# Patient Record
Sex: Female | Born: 1945 | Race: White | Hispanic: No | Marital: Married
Health system: Midwestern US, Community
[De-identification: ages and names within clinical notes are randomized; demographics above are authoritative.]

## PROBLEM LIST (undated history)

## (undated) DIAGNOSIS — I10 Essential (primary) hypertension: Secondary | ICD-10-CM

## (undated) DIAGNOSIS — R0609 Other forms of dyspnea: Secondary | ICD-10-CM

---

## 2016-08-30 ENCOUNTER — Ambulatory Visit
Admit: 2016-08-30 | Discharge: 2016-08-30 | Payer: MEDICARE | Attending: Interventional Cardiology | Primary: Interventional Cardiology

## 2016-08-30 DIAGNOSIS — I10 Essential (primary) hypertension: Secondary | ICD-10-CM

## 2016-08-30 NOTE — Progress Notes (Signed)
Perminder Lynnell DikeS Grewal, MD [facility]       Mitzi HansenArvind K Jeryn Cerney, MD [facility]     Doris CheadleBangalore S Sridhara, MD [facility]      Norberta KeensSunandan Wyline MoodA Pandya, MD [facility]     Hilary HertzShital R Parikh, MD [facility]       Santiago Bumpersajiv Singh, MD [facility]       [place] Office: [phone]       [phone]  ______________________________________________________________________    Impressions/Visit Diagnoses:    Diagnoses and all orders for this visit:    1. Essential hypertension  -     AMB POC EKG ROUTINE W/ 12 LEADS, INTER & REP  -     CBC WITH AUTOMATED DIFF; Future  -     LIPID PANEL; Future  -     METABOLIC PANEL, COMPREHENSIVE; Future  -     TSH, 3RD GENERATION; Future  -     HEMOGLOBIN A1C W/O EAG; Future    2. Mixed dyslipidemia  -     LIPID PANEL; Future  -     METABOLIC PANEL, COMPREHENSIVE; Future    3. Obesity (BMI 30.0-34.9)  -     METABOLIC PANEL, COMPREHENSIVE; Future  -     TSH, 3RD GENERATION; Future    4. Acquired hypothyroidism  -     LIPID PANEL; Future  -     TSH, 3RD GENERATION; Future    5. Controlled type 2 diabetes mellitus without complication, without long-term current use of insulin (HCC)  -     HEMOGLOBIN A1C W/O EAG; Future    6. Exertional dyspnea  -     2D ECHO COMPLETE ADULT (TTE) W OR WO CONTR; Future  -     NUCLEAR STRESS TEST; Future    7. Heart murmur  -     2D ECHO COMPLETE ADULT (TTE) W OR WO CONTR; Future  -     NUCLEAR STRESS TEST; Future             Recommendations/Plan:  Patient blood pressure is fairly well-controlled on the current medical regimen.  She has borderline diabetes.  I plan to repeat her labs, there are some mild EKG changes since her last EKG.  I will schedule her for lab work, and echocardiogram, nuclear stress test and carotid ultrasound especially in view of her recent symptoms of dizzines          Follow-up Disposition: Not on File  ______________________________________________________________________    HPI    Patient seen in our office after her last office visit in July 2016.  She  last saw Dr. Allen NorrisMentakis.  He was following with Dr. Kai Levinshandra Menon, who has now retired.  Patient states that she has a sensation of occasional squeezing sensation in the left arm, not related to any exertion, lasts less than a minute, this is followed by a sensation of gas in the stomach.  These episodes are infrequent  Patient states that she can walk about a mile before she gets short of breath.  She mild shortness of breath climbing more than one flight of stairs.  She denies any history of palpitations.  She had symptoms of vertigo about 2 weeks ago which have not resolved.  She has no previous history of any myocardia infarction, exertional angina, congestive heart failure, orthopnea or PND    Patient's previous surgeries include cholecystectomy, cataract surgery pilonidal cyst, arthroscopic meniscus surgery for the left knee  She has a history of hypertension, hypercholesterolemia, patient  has no history of any stroke or seizures.    Patient is allergic to the contrast agents with which she develops a rash    Patient quit smoking 30 years ago  Patient drinks about 3 glasses of wine a week    Patient mother died age of 44 from complication of Alzheimer's and possibly a heart condition .Her father died age of 32            Past Medical History:   Diagnosis Date   ??? Hyperlipidemia    ??? Hypertension    ??? Thyroid disease        Past Surgical History:   Procedure Laterality Date   ??? HX CATARACT REMOVAL Bilateral 2013   ??? HX CHOLECYSTECTOMY  2008         Current Outpatient Prescriptions:   ???  levothyroxine (SYNTHROID) 75 mcg tablet, Take 75 mcg by mouth daily., Disp: , Rfl:   ???  losartan-hydroCHLOROthiazide (HYZAAR) 100-25 mg per tablet, Take 1 Tab by mouth daily., Disp: , Rfl:   ???  rosuvastatin (CRESTOR) 10 mg tablet, Take 10 mg by mouth daily., Disp: , Rfl:   ???  TRAVATAN Z 0.004 % ophthalmic solution, , Disp: , Rfl:     Allergies   Allergen Reactions   ??? Contrast Agent [Iodine] Hives       Family History    Problem Relation Age of Onset   ??? Alzheimer Mother    ??? Heart Disease Father        Social History     Social History   ??? Marital status: MARRIED     Spouse name: N/A   ??? Number of children: N/A   ??? Years of education: N/A     Occupational History   ??? Not on file.     Social History Main Topics   ??? Smoking status: Former Smoker     Packs/day: 2.00     Years: 20.00     Quit date: 08/31/1986   ??? Smokeless tobacco: Never Used   ??? Alcohol use 1.8 oz/week     3 Glasses of wine per week   ??? Drug use: No   ??? Sexual activity: Not on file     Other Topics Concern   ??? Not on file     Social History Narrative   ??? No narrative on file         Review of systems  Review of Systems   Constitutional: Negative for weight loss.   HENT:        Popping sensation in the ears intermittently   Respiratory: Negative for cough and wheezing.    Cardiovascular: Positive for chest pain. Negative for palpitations, orthopnea, leg swelling and PND.   Musculoskeletal: Negative for myalgias.   Skin: Negative for rash.   Neurological: Positive for dizziness ( Fleeting).        Vertigo about 2 weeks ago, she went to an urgent care center where she was treated with meclizine with resolution of her symptoms in about a week.         Physical Exam:      Visit Vitals   ??? BP 122/68 (BP 1 Location: Right arm, BP Patient Position: Sitting)   ??? Pulse 66   ??? Resp 18   ??? Ht 5\' 5"  (1.651 m)   ??? Wt 200 lb (90.7 kg)   ??? BMI 33.28 kg/m2       Physical Exam:      Physical  Exam   Constitutional: She is oriented to person, place, and time. She appears well-developed and well-nourished. No distress.   HENT:   Head: Normocephalic.   Mouth/Throat: Oropharynx is clear and moist.   Eyes: No scleral icterus.   Neck: Neck supple. No JVD present. Carotid bruit is not present. No thyromegaly present.   Cardiovascular: Normal rate and regular rhythm.  Exam reveals no gallop and no friction rub.    Murmur ( gr1/6 murmur) heard.   Pulmonary/Chest: No respiratory distress. She has no wheezes. She has no rales.   Abdominal: She exhibits no distension and no mass. There is no tenderness. There is no rebound and no guarding.   Moderate obesity   Musculoskeletal: She exhibits no edema.   Neurological: She is alert and oriented to person, place, and time.   Skin: Skin is warm and dry. No rash noted. No erythema. No pallor.   Psychiatric: She has a normal mood and affect. Her behavior is normal. Judgment and thought content normal.   Vitals reviewed.    Tests - Procedures Reviewed      ECG   Results for orders placed or performed in visit on 08/30/16   AMB POC EKG ROUTINE W/ 12 LEADS, INTER & REP    Impression    Sinus  Rhythm   Low voltage in precordial leads.    -  Nonspecific T-abnormality.     ABNORMAL         Targeted ultrasound on 04/28/2014 was normal  Stress test:         Labs - reviewed     Lab data from 12/20/2014 reveals normal electrolytes BUN is 20 creatinine 0.67  Liver functions are normal  Total cholesterol 150 LDL 76  Hemoglobin 12.8 hematocrit 38.3 white count 5.1 platelet count of 210,000  Hemoglobin A1c of 6.1    Care Plan Discussion   [x] Patient   [] Family   [] Other Physician :    Counseling Performed   []  Smoking cessation 3-10 minutes []  Smoking cessation >10 minutes     []  Dietary Counseling  []  Exercise    []  Importance of Weight Loss and Risks of Obesity   []  Anticoagulation Counseling  []  Fall Precautions   []  Symptoms that would require immediate follow up    []  Medication compliance/usage/side effect     []   >50% of visit spent in counseling and coordination of care  Total time spent approximately  minutes    []   Additional time spent approximately 30 minutes    Records Requested / Reviewed   []  Labs/Test results requested from other physicians  []  Hospital records requested from   []  Hospital records / Records from other physicians reviewed - summary as above. Copies in chart.

## 2016-09-06 NOTE — Telephone Encounter (Signed)
Requested Prescriptions     Pending Prescriptions Disp Refills   ??? levothyroxine (SYNTHROID) 75 mcg tablet 90 Tab 1     Sig: Take 1 Tab by mouth daily.   ??? losartan-hydroCHLOROthiazide (HYZAAR) 100-25 mg per tablet 90 Tab 1     Sig: Take 1 Tab by mouth daily.   ??? rosuvastatin (CRESTOR) 10 mg tablet 90 Tab 1     Sig: Take 1 Tab by mouth daily.

## 2016-09-07 MED ORDER — LEVOTHYROXINE 75 MCG TAB
75 mcg | ORAL_TABLET | Freq: Every day | ORAL | 1 refills | Status: DC
Start: 2016-09-07 — End: 2017-01-15

## 2016-09-07 MED ORDER — LOSARTAN-HYDROCHLOROTHIAZIDE 100 MG-25 MG TAB
100-25 mg | ORAL_TABLET | Freq: Every day | ORAL | 1 refills | Status: DC
Start: 2016-09-07 — End: 2017-01-15

## 2016-09-07 MED ORDER — ROSUVASTATIN 10 MG TAB
10 mg | ORAL_TABLET | Freq: Every day | ORAL | 1 refills | Status: DC
Start: 2016-09-07 — End: 2017-01-17

## 2016-10-01 ENCOUNTER — Encounter

## 2016-10-04 ENCOUNTER — Ambulatory Visit
Admit: 2016-10-04 | Discharge: 2016-10-04 | Payer: MEDICARE | Attending: Interventional Cardiology | Primary: Interventional Cardiology

## 2016-10-04 DIAGNOSIS — I10 Essential (primary) hypertension: Secondary | ICD-10-CM

## 2016-10-04 NOTE — Progress Notes (Signed)
Perminder Lynnell Dike, MD [facility]       [name], MD [facility]     [name], MD [facility]      [name] [name], MD [facility]     Hilary Hertz, MD [facility]       [name], MD [facility] Office: [phone]       [facility] [phone]  ______________________________________________________________________    Impressions/Visit Diagnoses:    Diagnoses and all orders for this visit:    1. Benign essential HTN  -     CT CORONARY ART W STRUC MORPH FUNC; Future    2. Mixed dyslipidemia  -     CT CORONARY ART W STRUC MORPH FUNC; Future    3. Abnormal stress ECG with treadmill  -     CT CORONARY ART W STRUC MORPH FUNC; Future    4. Exertional dyspnea  -     CT CORONARY ART W STRUC MORPH FUNC; Future             Recommendations/Plan:  Patient blood pressure is at goal.  Her stress test was markedly positive by EKG criteria although nuclear images did not reveal any ischemia or infarction.  She also developed shortness of breath during the stress.  Her echocardiogram reveals normal LV function and no significant valvular heart disease.  Her carotid ultrasound only revealed minor plaque disease with no obstructive lesions.  Her hemoglobin A1c and thyroid functions are normal  I have advised her to increase her Crestor to 20 mg a day  Although the nuclear images do not reveal any ischemia or infarction, her stress test was markedly positive with symptoms of shortness of breath.  I cannot entirely exclude underlying coronary disease.  I have advised her to undergo CT coronary angiogram to exclude any significant coronary disease.  If does not reveal any obstructive lesion she will remain on aggressive risk factor modification.  I have also advised her to watch her diet, exercise and attempt to lose weight          Follow-up Disposition:  Return in about 6 weeks (around 11/15/2016).  ______________________________________________________________________    HPI   Patient is stable from a cardiac standpoint.  Her symptoms of dizziness have now resolved.  She states she does a lot of for tight knitting, and has occasional discomfort in the pectoral area      Past Medical History:   Diagnosis Date   ??? Hyperlipidemia    ??? Hypertension    ??? Thyroid disease        Past Surgical History:   Procedure Laterality Date   ??? HX CATARACT REMOVAL Bilateral 2013   ??? HX CHOLECYSTECTOMY  2008         Current Outpatient Prescriptions:   ???  levothyroxine (SYNTHROID) 75 mcg tablet, Take 1 Tab by mouth daily., Disp: 90 Tab, Rfl: 1  ???  losartan-hydroCHLOROthiazide (HYZAAR) 100-25 mg per tablet, Take 1 Tab by mouth daily., Disp: 90 Tab, Rfl: 1  ???  rosuvastatin (CRESTOR) 10 mg tablet, Take 1 Tab by mouth daily., Disp: 90 Tab, Rfl: 1  ???  TRAVATAN Z 0.004 % ophthalmic solution, , Disp: , Rfl:     Allergies   Allergen Reactions   ??? Contrast Agent [Iodine] Hives       Family History   Problem Relation Age of Onset   ??? Alzheimer Mother    ??? Heart Disease Father  Social History     Social History   ??? Marital status: MARRIED     Spouse name: N/A   ??? Number of children: N/A   ??? Years of education: N/A     Occupational History   ??? Not on file.     Social History Main Topics   ??? Smoking status: Former Smoker     Packs/day: 2.00     Years: 20.00     Quit date: 08/31/1986   ??? Smokeless tobacco: Never Used   ??? Alcohol use 1.8 oz/week     3 Glasses of wine per week   ??? Drug use: No   ??? Sexual activity: Not on file     Other Topics Concern   ??? Not on file     Social History Narrative         Review of systems  Review of Systems   Constitutional: Negative for weight loss.   Respiratory: Negative for cough and wheezing.    Cardiovascular: Negative for chest pain ( when does a lot of knitting), palpitations, orthopnea, leg swelling and PND.   Musculoskeletal: Negative for myalgias.   Skin: Negative for rash.   Neurological: Negative for dizziness.   Psychiatric/Behavioral: The patient is nervous/anxious.           Physical Exam:      Visit Vitals   ??? BP 112/60 (BP 1 Location: Right arm, BP Patient Position: Sitting)   ??? Pulse 80   ??? Resp 18   ??? Ht  (1.651 m)   ??? Wt 201 lb (91.2 kg)   ??? BMI 33.45 kg/m2       Physical Exam:      Physical Exam   Constitutional: She is oriented to person, place, and time. She appears well-developed and well-nourished. No distress.   HENT:   Head: Normocephalic.   Mouth/Throat: Oropharynx is clear and moist.   Eyes: No scleral icterus.   Neck: Neck supple. No JVD present. Carotid bruit is not present. No thyromegaly present.   Cardiovascular: Normal rate, regular rhythm and normal heart sounds.  Exam reveals no gallop and no friction rub.    No murmur heard.  Pulmonary/Chest: No respiratory distress. She has no wheezes. She has no rales.   Abdominal: She exhibits no distension and no mass. There is no tenderness. There is no rebound and no guarding.   Moderate obesity   Musculoskeletal: She exhibits no edema.   Neurological: She is alert and oriented to person, place, and time.   Skin: Skin is warm and dry. No rash noted. No erythema. No pallor.   Psychiatric: She has a normal mood and affect. Her behavior is normal. Judgment and thought content normal.   Vitals reviewed.    Tests - Procedures Reviewed          Echo : 09/27/2016:          Stress test: 09/27/2016:    Carotid ultrasound:      Labs - reviewed         Care Plan Discussion   Patient   Family   Other Physician :    Counseling Performed    Smoking cessation 3-10 minutes  Smoking cessation >10 minutes      Dietary Counseling   Exercise     Importance of Weight Loss and Risks of Obesity    Anticoagulation Counseling   Fall Precautions    Symptoms that would require immediate follow up      Medication compliance/usage/side effect       >50% of visit spent in counseling and coordination of care  Total time spent approximately  minutes      Additional time spent approximately __ minutes     Records Requested / Reviewed    Labs/Test results requested from other physicians   Hospital records requested from    Hospital records / Records from other physicians reviewed - summary as above. Copies in chart.

## 2016-10-11 MED ORDER — PREDNISONE 10 MG TAB
10 mg | ORAL_TABLET | Freq: Three times a day (TID) | ORAL | 0 refills | Status: AC
Start: 2016-10-11 — End: 2016-10-14

## 2016-10-31 ENCOUNTER — Encounter

## 2016-11-15 ENCOUNTER — Ambulatory Visit
Admit: 2016-11-15 | Discharge: 2016-11-15 | Payer: MEDICARE | Attending: Interventional Cardiology | Primary: Interventional Cardiology

## 2016-11-15 DIAGNOSIS — I10 Essential (primary) hypertension: Secondary | ICD-10-CM

## 2016-11-15 NOTE — Progress Notes (Addendum)
Amanda Lynnell Dike, MD [name]       Amanda Hansen, MD [facility]     [name], MD [facility]      [name] [name], MD [facility]     Hilary Hertz, MD [facility]       [name], MD [facility] Office: [phone]       [facility] [phone]  ______________________________________________________________________    Impressions/Visit Diagnoses:    Diagnoses and all orders for this visit:    1. Benign essential HTN    2. Mixed dyslipidemia             Recommendations/Plan:  I had a long discussion with the patient regarding the results of her CT coronary angiogram.  Her coronary calcium score 0.  She only has about 30% stenosis of the left circumflex coronary artery.. However the mention of dilatation of the right ventricle.  I will discuss this with the radiologist.  Her blood pressure is well controlled.  Patient is unable to tolerate a higher dose of rosuvastatin.  I plan to see the patient in the office in about 4 months time.      CT RV findings discussed with Dr Eduard Clos,( radiologist),he feels the RV size and volumes are normal for her weight.  No further work up for RV (message left for patient as she requested)          Follow-up Disposition:  Return in about 4 months (around 03/17/2017).  ______________________________________________________________________    HPI  Patient is doing well since her last office visit.  She has lost 7 pounds of weight.  Patient exercises on the bicycle and the treadmill at home.  She exercises for about half an hour.  She is at a speed of 2.5 mph with no incline.      Past Medical History:   Diagnosis Date   ??? Hyperlipidemia    ??? Hypertension    ??? Thyroid disease        Past Surgical History:   Procedure Laterality Date   ??? HX CATARACT REMOVAL Bilateral 2013   ??? HX CHOLECYSTECTOMY  2008         Current Outpatient Prescriptions:   ???  aspirin delayed-release 81 mg tablet, Take  by mouth daily., Disp: , Rfl:    ???  levothyroxine (SYNTHROID) 75 mcg tablet, Take 1 Tab by mouth daily., Disp: 90 Tab, Rfl: 1  ???  losartan-hydroCHLOROthiazide (HYZAAR) 100-25 mg per tablet, Take 1 Tab by mouth daily., Disp: 90 Tab, Rfl: 1  ???  rosuvastatin (CRESTOR) 10 mg tablet, Take 1 Tab by mouth daily., Disp: 90 Tab, Rfl: 1  ???  TRAVATAN Z 0.004 % ophthalmic solution, , Disp: , Rfl:     Allergies   Allergen Reactions   ??? Contrast Agent [Iodine] Hives       Family History   Problem Relation Age of Onset   ??? Alzheimer Mother    ??? Heart Disease Father        Social History     Social History   ??? Marital status: MARRIED     Spouse name: N/A   ??? Number of children: N/A   ??? Years of education: N/A     Occupational History   ??? Not on file.     Social History Main Topics   ??? Smoking status: Former Smoker     Packs/day: 2.00     Years: 20.00  Quit date: 08/31/1986   ??? Smokeless tobacco: Never Used   ??? Alcohol use 1.8 oz/week     3 Glasses of wine per week   ??? Drug use: No   ??? Sexual activity: Not on file     Other Topics Concern   ??? Not on file     Social History Narrative         Review of systems  Review of Systems   Constitutional: Negative for weight loss.   Eyes:        History of glaucoma   Respiratory: Positive for cough. Negative for wheezing.    Cardiovascular: Negative for chest pain, palpitations, orthopnea, leg swelling and PND.   Musculoskeletal: Positive for joint pain ( right hip and left knee discomfort). Negative for myalgias.   Skin: Negative for rash.   Neurological: Negative for dizziness.         Physical Exam:      Visit Vitals   ??? BP 104/62 (BP 1 Location: Right arm, BP Patient Position: Sitting)   ??? Resp 16   ??? Ht 5\' 5"  (1.651 m)   ??? Wt 194 lb (88 kg)   ??? BMI 32.28 kg/m2       Physical Exam:      Physical Exam   Constitutional: She is oriented to person, place, and time. She appears well-developed and well-nourished. No distress.   HENT:   Head: Normocephalic.   Mouth/Throat: Oropharynx is clear and moist.    Eyes: No scleral icterus.   Neck: Neck supple. No JVD present. Carotid bruit is not present. No thyromegaly present.   Cardiovascular: Normal rate, regular rhythm and normal heart sounds.  Exam reveals no gallop and no friction rub.    No murmur heard.  Pulmonary/Chest: No respiratory distress. She has no wheezes. She has no rales.   Abdominal: She exhibits no distension and no mass. There is no tenderness. There is no rebound and no guarding.   Mild obesity   Musculoskeletal: She exhibits no edema.   Neurological: She is alert and oriented to person, place, and time.   Skin: Skin is warm and dry. No rash noted. No erythema. No pallor.   Psychiatric: She has a normal mood and affect. Her behavior is normal. Judgment and thought content normal.   Vitals reviewed.    Tests - Procedures Reviewed      CT coronary angiogram:      Labs - reviewed         Care Plan Discussion   [x] Patient   [] Family   [] Other Physician :    Counseling Performed   []  Smoking cessation 3-10 minutes []  Smoking cessation >10 minutes     []  Dietary Counseling  [x]  Exercise    [x]  Importance of Weight Loss and Risks of Obesity   []  Anticoagulation Counseling  []  Fall Precautions   []  Symptoms that would require immediate follow up    []  Medication compliance/usage/side effect     []   >50% of visit spent in counseling and coordination of care  Total time spent approximately  minutes    []   Additional time spent approximately __ minutes    Records Requested / Reviewed   []  Labs/Test results requested from other physicians  []  Hospital records requested from   []  Hospital records / Records from other physicians reviewed - summary as above. Copies in chart.

## 2017-01-16 MED ORDER — LOSARTAN-HYDROCHLOROTHIAZIDE 100 MG-25 MG TAB
100-25 mg | ORAL_TABLET | ORAL | 1 refills | Status: DC
Start: 2017-01-16 — End: 2017-04-22

## 2017-01-16 MED ORDER — LEVOTHYROXINE 75 MCG TAB
75 mcg | ORAL_TABLET | ORAL | 1 refills | Status: DC
Start: 2017-01-16 — End: 2017-04-22

## 2017-01-17 MED ORDER — ROSUVASTATIN 10 MG TAB
10 mg | ORAL_TABLET | Freq: Every day | ORAL | 1 refills | Status: DC
Start: 2017-01-17 — End: 2017-04-22

## 2017-01-17 NOTE — Telephone Encounter (Signed)
Requested Prescriptions     Pending Prescriptions Disp Refills   ??? rosuvastatin (CRESTOR) 10 mg tablet 90 Tab 1     Sig: Take 1 Tab by mouth daily.

## 2017-03-28 ENCOUNTER — Encounter: Attending: Interventional Cardiology | Primary: Interventional Cardiology

## 2017-04-04 ENCOUNTER — Encounter: Attending: Interventional Cardiology | Primary: Interventional Cardiology

## 2017-04-15 ENCOUNTER — Encounter: Payer: MEDICARE | Attending: Interventional Cardiology | Primary: Interventional Cardiology

## 2017-04-18 ENCOUNTER — Encounter

## 2017-04-20 LAB — LIPID PANEL
Cholesterol, total: 145 mg/dL (ref 100–199)
HDL Cholesterol: 52 mg/dL (ref >39)
LDL, calculated: 85 mg/dL (ref 0–99)
Triglyceride: 40 mg/dL (ref 0–149)
VLDL, calculated: 8 mg/dL (ref 5–40)

## 2017-04-20 LAB — METABOLIC PANEL, COMPREHENSIVE
A-G Ratio: 1.9 (ref 1.2–2.2)
ALT (SGPT): 16 IU/L (ref 0–32)
AST (SGOT): 21 IU/L (ref 0–40)
Albumin: 4.4 g/dL (ref 3.5–4.8)
Alk. phosphatase: 52 IU/L (ref 39–117)
BUN/Creatinine ratio: 29 — ABNORMAL HIGH (ref 12–28)
BUN: 23 mg/dL (ref 8–27)
Bilirubin, total: 0.3 mg/dL (ref 0.0–1.2)
CO2: 26 mmol/L (ref 20–29)
Calcium: 9.7 mg/dL (ref 8.7–10.3)
Chloride: 100 mmol/L (ref 96–106)
Creatinine: 0.79 mg/dL (ref 0.57–1.00)
GFR est AA: 87 mL/min/{1.73_m2} (ref >59)
GFR est non-AA: 76 mL/min/{1.73_m2} (ref >59)
GLOBULIN, TOTAL: 2.3 g/dL (ref 1.5–4.5)
Glucose: 101 mg/dL — ABNORMAL HIGH (ref 65–99)
Potassium: 3.8 mmol/L (ref 3.5–5.2)
Protein, total: 6.7 g/dL (ref 6.0–8.5)
Sodium: 142 mmol/L (ref 134–144)

## 2017-04-20 LAB — CBC WITH AUTOMATED DIFF
ABS. BASOPHILS: 0 10*3/uL (ref 0.0–0.2)
ABS. EOSINOPHILS: 0.1 10*3/uL (ref 0.0–0.4)
ABS. IMM. GRANS.: 0 10*3/uL (ref 0.0–0.1)
ABS. MONOCYTES: 0.3 10*3/uL (ref 0.1–0.9)
ABS. NEUTROPHILS: 2.8 10*3/uL (ref 1.4–7.0)
Abs Lymphocytes: 1.5 10*3/uL (ref 0.7–3.1)
BASOPHILS: 0 %
EOSINOPHILS: 2 %
HCT: 34.6 % (ref 34.0–46.6)
HGB: 12 g/dL (ref 11.1–15.9)
IMMATURE GRANULOCYTES: 0 %
Lymphocytes: 33 %
MCH: 31.4 pg (ref 26.6–33.0)
MCHC: 34.7 g/dL (ref 31.5–35.7)
MCV: 91 fL (ref 79–97)
MONOCYTES: 5 %
NEUTROPHILS: 60 %
PLATELET: 182 10*3/uL (ref 150–379)
RBC: 3.82 x10E6/uL (ref 3.77–5.28)
RDW: 13.2 % (ref 12.3–15.4)
WBC: 4.7 10*3/uL (ref 3.4–10.8)

## 2017-04-20 LAB — TSH 3RD GENERATION: TSH: 1.66 u[IU]/mL (ref 0.450–4.500)

## 2017-04-22 ENCOUNTER — Ambulatory Visit
Admit: 2017-04-22 | Discharge: 2017-04-22 | Payer: MEDICARE | Attending: Interventional Cardiology | Primary: Interventional Cardiology

## 2017-04-22 DIAGNOSIS — I10 Essential (primary) hypertension: Secondary | ICD-10-CM

## 2017-04-22 NOTE — Progress Notes (Signed)
Perminder Christen Butter, MD [facility]       Arlana Hove, MD [place], MD [facility]      Voncille Lo Carolynne Edouard, MD [place], MD [place], MD [facility] Office: [phone]       [facility] [phone]  ______________________________________________________________________    Impressions/Visit Diagnoses:    Diagnoses and all orders for this visit:    1. Essential hypertension  -     AMB POC EKG ROUTINE W/ 12 LEADS, INTER & REP    2. Benign essential HTN    3. Mixed dyslipidemia    4. Coronary artery disease involving native coronary artery of native heart without angina pectoris             Recommendations/Plan:  1. Patient is stable from a cardiac standpoint.  Her blood pressure is at goal.  She remains completely asymptomatic.  Her LDL is not quite at goal.  I have advised her to increase the dose of Rosuvastatin to 20 mg a day.  Patient states that she cannot tolerate more than 10 mg of rosuvastatin.          Follow-up Disposition:  Return in about 4 months (around 08/20/2017).  ______________________________________________________________________    HPI  Patient was stable since the last office visit.  She has had no interval illnesses.  Since April 2001 patient has lost 25 pounds of weight.  Patient is a very active at her house including going up and down the stairs, packing with no symptoms of chest pain or shortness of breath.  She has no orthopnea or PND.  She states she walks about 4 miles in and around her house.      Past Medical History:   Diagnosis Date   ??? Hyperlipidemia    ??? Hypertension    ??? Thyroid disease        Past Surgical History:   Procedure Laterality Date   ??? HX CATARACT REMOVAL Bilateral 2013   ??? HX CHOLECYSTECTOMY  2008         Current Outpatient Medications:   ???  rosuvastatin (CRESTOR) 10 mg tablet, Take 1 Tab by mouth daily., Disp: 90 Tab, Rfl: 1  ???  levothyroxine (SYNTHROID) 75 mcg tablet, TAKE 1 TABLET EVERY DAY, Disp:  90 Tab, Rfl: 1  ???  losartan-hydroCHLOROthiazide (HYZAAR) 100-25 mg per tablet, TAKE 1 TABLET EVERY DAY, Disp: 90 Tab, Rfl: 1  ???  aspirin delayed-release 81 mg tablet, Take  by mouth daily., Disp: , Rfl:   ???  TRAVATAN Z 0.004 % ophthalmic solution, , Disp: , Rfl:     Allergies   Allergen Reactions   ??? Contrast Agent [Iodine] Hives       Family History   Problem Relation Age of Onset   ??? Alzheimer Mother    ??? Heart Disease Father        Social History     Socioeconomic History   ??? Marital status: MARRIED     Spouse name: Not on file   ??? Number of children: Not on file   ??? Years of education: Not on file   ??? Highest education level: Not on file   Social Needs   ??? Financial resource strain: Not on file   ??? Food insecurity - worry: Not on file   ??? Food insecurity - inability:  Not on file   ??? Transportation needs - medical: Not on file   ??? Transportation needs - non-medical: Not on file   Occupational History   ??? Not on file   Tobacco Use   ??? Smoking status: Former Smoker     Packs/day: 2.00     Years: 20.00     Pack years: 40.00     Last attempt to quit: 08/31/1986     Years since quitting: 30.6   ??? Smokeless tobacco: Never Used   Substance and Sexual Activity   ??? Alcohol use: Yes     Alcohol/week: 1.8 oz     Types: 3 Glasses of wine per week   ??? Drug use: No   ??? Sexual activity: Not on file   Other Topics Concern   ??? Not on file   Social History Narrative   ??? Not on file         Review of systems  Review of Systems   Constitutional: Negative for weight loss.   Respiratory: Negative for cough and wheezing.    Cardiovascular: Negative for chest pain, palpitations, orthopnea, leg swelling and PND.   Musculoskeletal: Negative for myalgias.   Skin: Negative for rash.   Neurological: Negative for dizziness.         Physical Exam:      Visit Vitals  BP 114/60 (BP 1 Location: Right arm, BP Patient Position: Sitting)   Pulse 67   Resp 16   Ht 5' 5"  (1.651 m)   Wt 176 lb (79.8 kg)   BMI 29.29 kg/m??       Physical Exam:       Physical Exam   Constitutional: She is oriented to person, place, and time. She appears well-developed and well-nourished. No distress.   HENT:   Head: Normocephalic.   Mouth/Throat: Oropharynx is clear and moist.   Eyes: No scleral icterus.   Neck: Neck supple. No JVD present. Carotid bruit is not present. No thyromegaly present.   Cardiovascular: Normal rate, regular rhythm and normal heart sounds. Exam reveals no gallop and no friction rub.   No murmur heard.  Pulmonary/Chest: No respiratory distress. She has no wheezes. She has no rales.   Abdominal: She exhibits no distension and no mass. There is no tenderness. There is no rebound and no guarding.   Musculoskeletal: She exhibits no edema.   Neurological: She is alert and oriented to person, place, and time.   Skin: Skin is warm and dry. No rash noted. No erythema. No pallor.   Psychiatric: She has a normal mood and affect. Her behavior is normal. Judgment and thought content normal.   Vitals reviewed.    Tests - Procedures Reviewed      ECG   Results for orders placed or performed in visit on 04/22/17   AMB POC EKG ROUTINE W/ 12 LEADS, INTER & REP    Impression    Sinus  Rhythm   nonspecific st t wave changes    ABNORMAL               Labs - reviewed     Results for AMEYA, VOWELL (MRN [id number]) as of 04/22/2017 14:04   Ref. Range 04/19/2017 09:07   WBC Latest Ref Range: 3.4 - 10.8 x10E3/uL 4.7   RBC Latest Ref Range: 3.77 - 5.28 x10E6/uL 3.82   HGB Latest Ref Range: 11.1 - 15.9 g/dL 12.0   HCT Latest Ref Range: 34.0 - 46.6 % 34.6   MCV  Latest Ref Range: 79 - 97 fL 91   MCH Latest Ref Range: 26.6 - 33.0 pg 31.4   MCHC Latest Ref Range: 31.5 - 35.7 g/dL 34.7   RDW Latest Ref Range: 12.3 - 15.4 % 13.2   PLATELET Latest Ref Range: 150 - 379 x10E3/uL 182   Results for CHRISHAWNA, FARINA (MRN [id number]) as of 04/22/2017 14:04   Ref. Range 04/19/2017 09:07   Sodium Latest Ref Range: 134 - 144 mmol/L 142   Potassium Latest Ref Range: 3.5 - 5.2 mmol/L 3.8    Chloride Latest Ref Range: 96 - 106 mmol/L 100   CO2 Latest Ref Range: 20 - 29 mmol/L 26   Glucose Latest Ref Range: 65 - 99 mg/dL 101 (H)   BUN Latest Ref Range: 8 - 27 mg/dL 23   Creatinine Latest Ref Range: 0.57 - 1.00 mg/dL 0.79   BUN/Creatinine ratio Latest Ref Range: 12 - 28  29 (H)   Calcium Latest Ref Range: 8.7 - 10.3 mg/dL 9.7   GFR est non-AA Latest Ref Range: >59 mL/min/1.73 76   GFR est AA Latest Ref Range: >59 mL/min/1.73 87   Bilirubin, total Latest Ref Range: 0.0 - 1.2 mg/dL 0.3   Protein, total Latest Ref Range: 6.0 - 8.5 g/dL 6.7   Albumin Latest Ref Range: 3.5 - 4.8 g/dL 4.4   A-G Ratio Latest Ref Range: 1.2 - 2.2  1.9   ALT (SGPT) Latest Ref Range: 0 - 32 IU/L 16   AST Latest Ref Range: 0 - 40 IU/L 21   Alk. phosphatase Latest Ref Range: 39 - 117 IU/L 52   Triglyceride Latest Ref Range: 0 - 149 mg/dL 40   Cholesterol, total Latest Ref Range: 100 - 199 mg/dL 145   HDL Cholesterol Latest Ref Range: >39 mg/dL 52   LDL, calculated Latest Ref Range: 0 - 99 mg/dL 85   VLDL, calculated Latest Ref Range: 5 - 40 mg/dL 8       Care Plan Discussion   [] Patient   [] Family   [] Other Physician :    Counseling Performed   []  Smoking cessation 3-10 minutes []  Smoking cessation >10 minutes     []  Dietary Counseling  []  Exercise    []  Importance of Weight Loss and Risks of Obesity   []  Anticoagulation Counseling  []  Fall Precautions   []  Symptoms that would require immediate follow up    []  Medication compliance/usage/side effect     []   >50% of visit spent in counseling and coordination of care  Total time spent approximately  minutes    []   Additional time spent approximately __ minutes    Records Requested / Reviewed   []  Labs/Test results requested from other physicians  []  Hospital records requested from   []  Hospital records / Records from other physicians reviewed - summary as above. Copies in chart.

## 2017-04-22 NOTE — Telephone Encounter (Signed)
Requested Prescriptions     Pending Prescriptions Disp Refills   ??? losartan-hydroCHLOROthiazide (HYZAAR) 100-25 mg per tablet 90 Tab 1     Sig: Take 1 Tab by mouth daily.   ??? rosuvastatin (CRESTOR) 10 mg tablet 90 Tab 1     Sig: Take 1 Tab by mouth daily.   ??? levothyroxine (SYNTHROID) 75 mcg tablet 90 Tab 1     Sig: Take 1 Tab by mouth.

## 2017-04-24 MED ORDER — LOSARTAN-HYDROCHLOROTHIAZIDE 100 MG-25 MG TAB
100-25 mg | ORAL_TABLET | Freq: Every day | ORAL | 1 refills | Status: AC
Start: 2017-04-24 — End: ?

## 2017-04-24 MED ORDER — LEVOTHYROXINE 75 MCG TAB
75 mcg | ORAL_TABLET | Freq: Every day | ORAL | 1 refills | Status: AC
Start: 2017-04-24 — End: ?

## 2017-04-24 MED ORDER — ROSUVASTATIN 10 MG TAB
10 mg | ORAL_TABLET | Freq: Every day | ORAL | 1 refills | Status: AC
Start: 2017-04-24 — End: ?

## 2022-01-05 IMAGING — DX LUMBAR SPINE COMPLETE 4 VIEWS
1 series · 5 of 5 positions shown · non-contrast
Comparison: None

________________________________________________________________________________________________ 
LUMBAR SPINE COMPLETE 4 VIEWS, 01/05/2022 [DATE]: 
CLINICAL INDICATION:  Right hip pain

[Series 1: AP · U · 0.14mm/px · 5 of 5 slices shown]
[im 1/5]
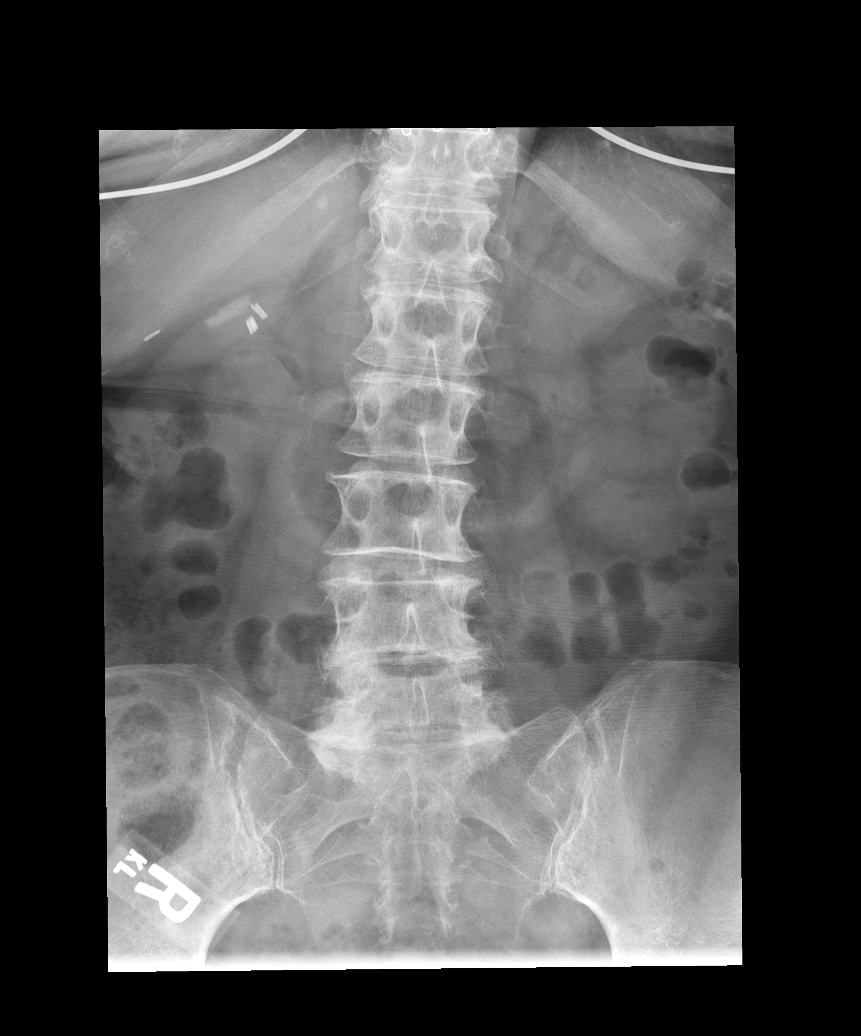
[im 2/5]
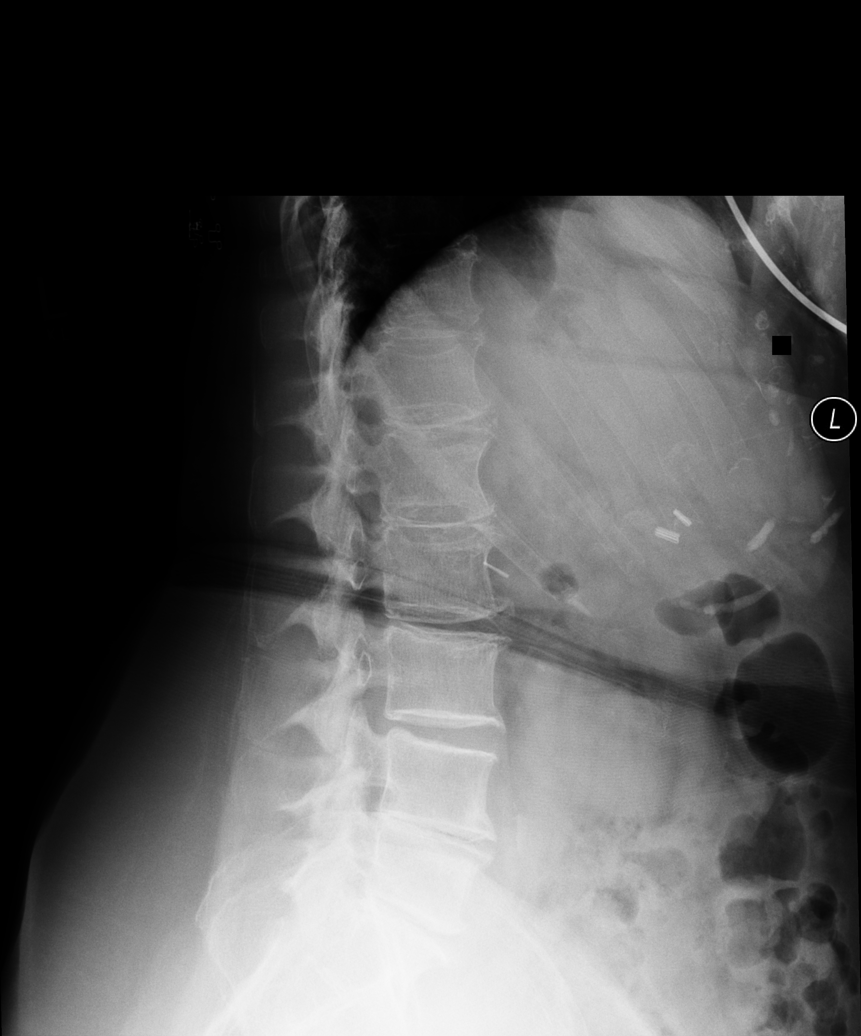
[im 3/5]
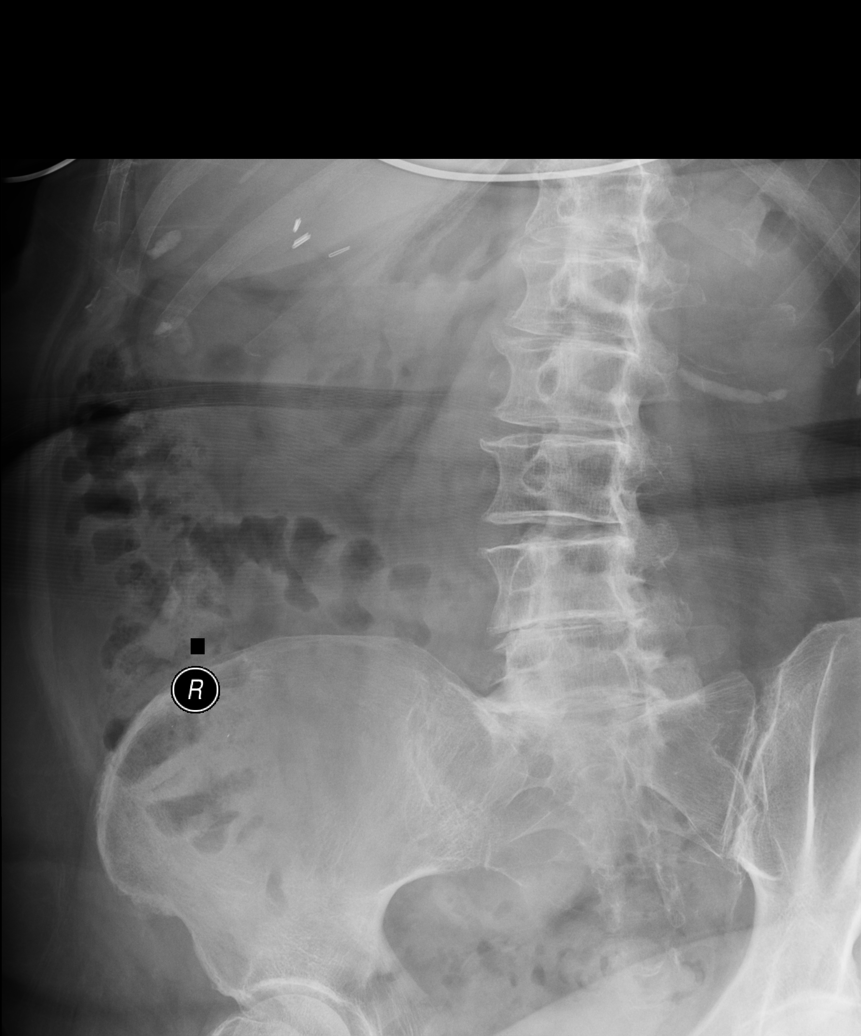
[im 4/5]
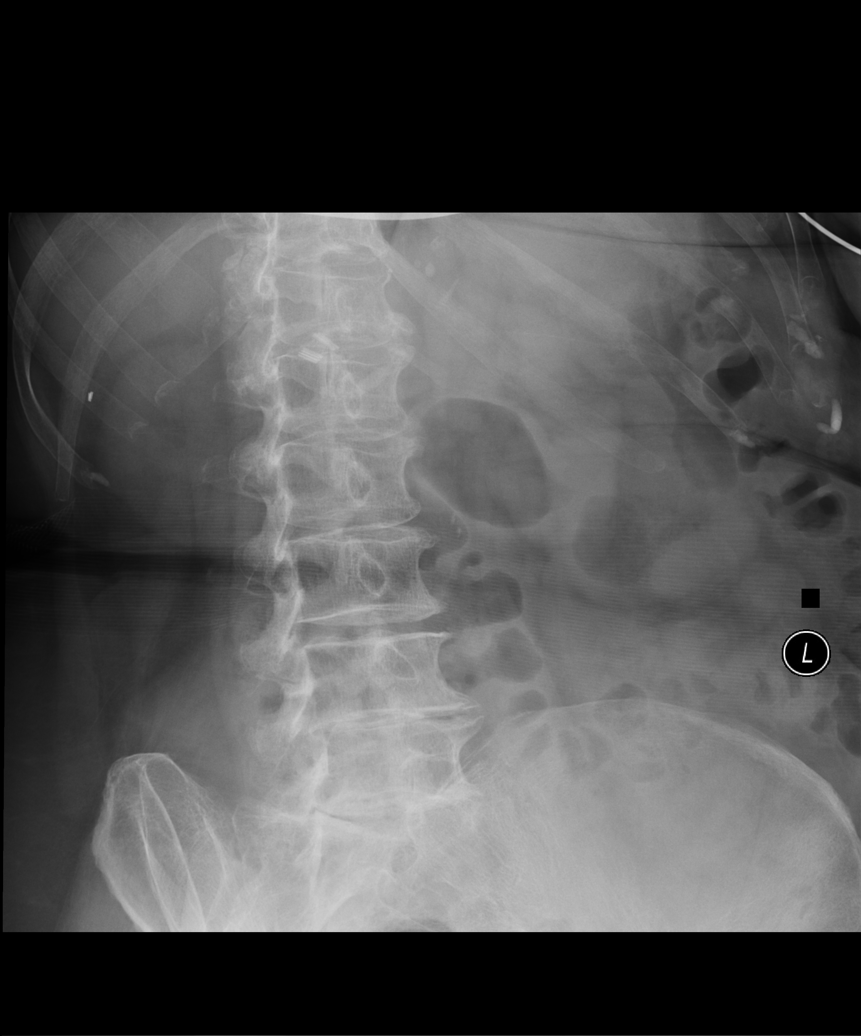
[im 5/5]
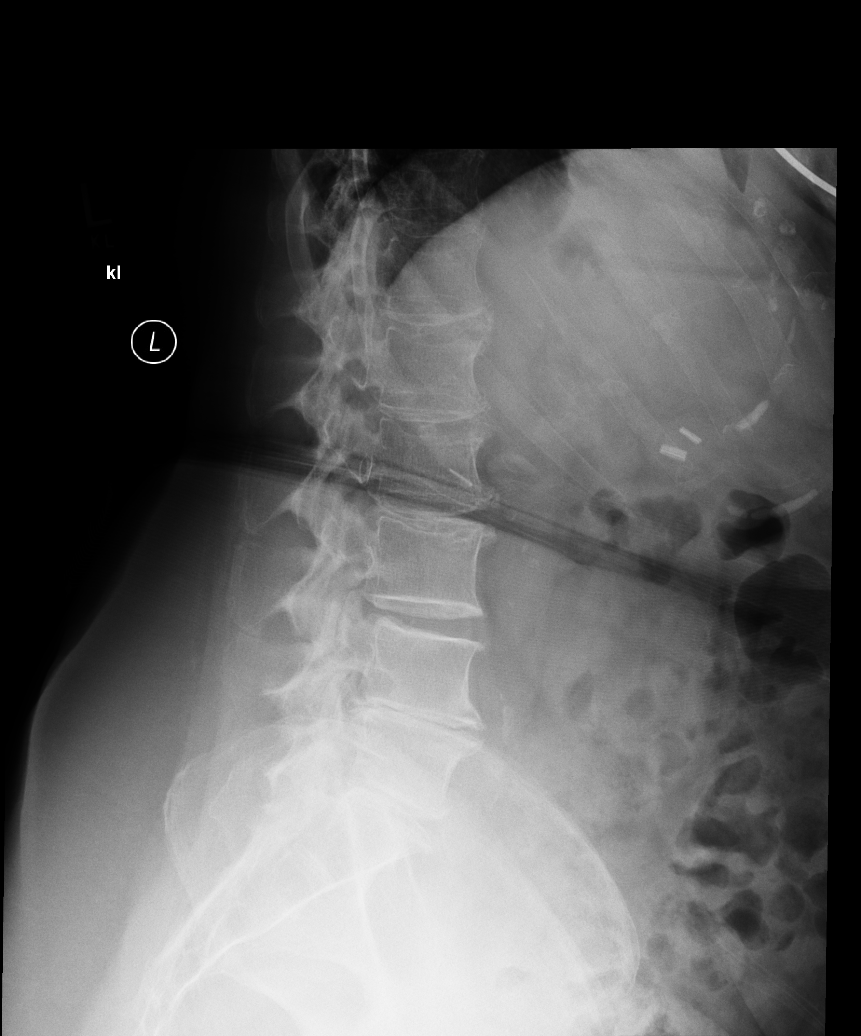

[5 of 5 positions shown; findings below may reference images not displayed]

FINDINGS: Lumbar vertebral heights are intact. There is mild upper lumbar 
dextrocurvature. Marked disc narrowing at L4-5 and L5-S1. Less than grade 1 
retrolisthesis at L3-4 with moderate disc narrowing. Facet changes throughout 
the lumbar spine. Oblique views show no spondylolysis. Surgical clips at the 
porta hepatis.
IMPRESSION: Degenerative changes as described. MRI would be useful if indicated.

## 2022-01-05 IMAGING — DX HIP 2 VIEWS RIGHT WITH PELVIS
1 series · 3 of 3 positions shown · non-contrast
Comparison: None.

________________________________________________________________________________________________ 
HIP 2 VIEWS RIGHT WITH PELVIS, 01/05/2022 [DATE]: 
CLINICAL INDICATION: Pain in right hip.

[Series 1: AP · U · 0.14mm/px · 3 of 3 slices shown]
[im 1/3]
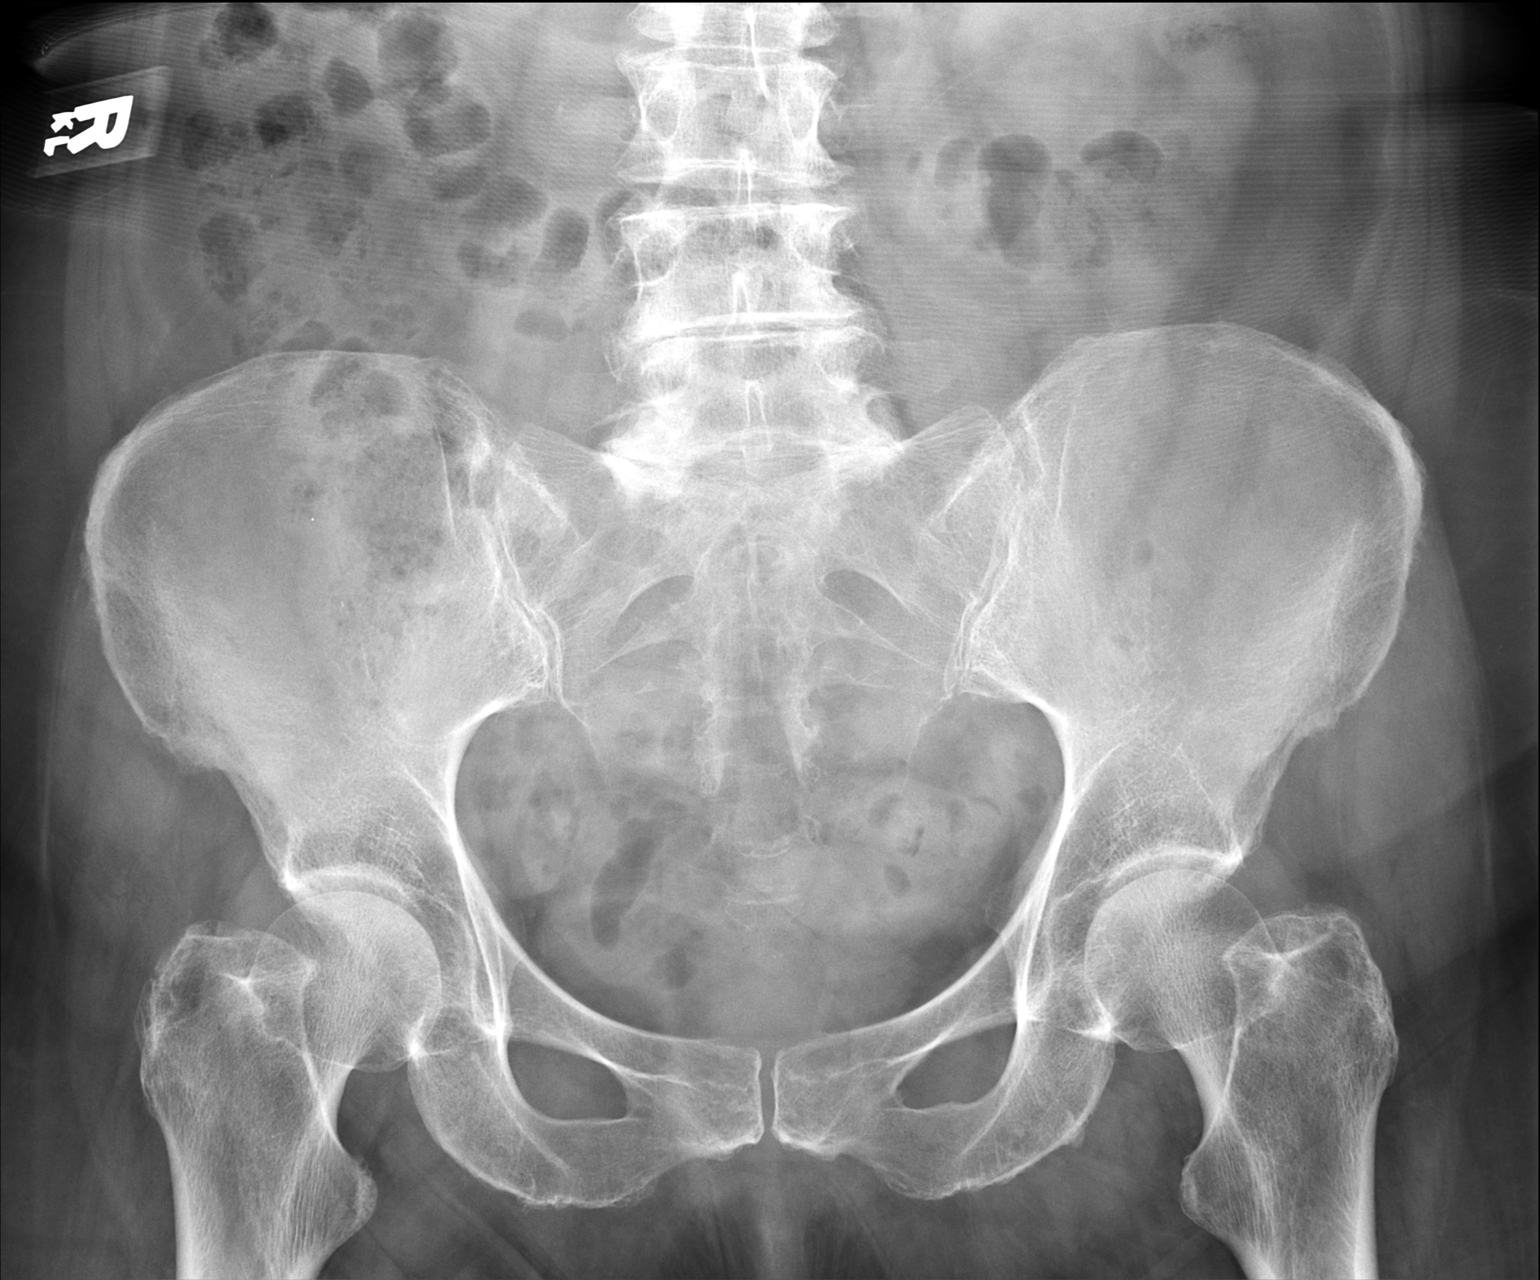
[im 2/3]
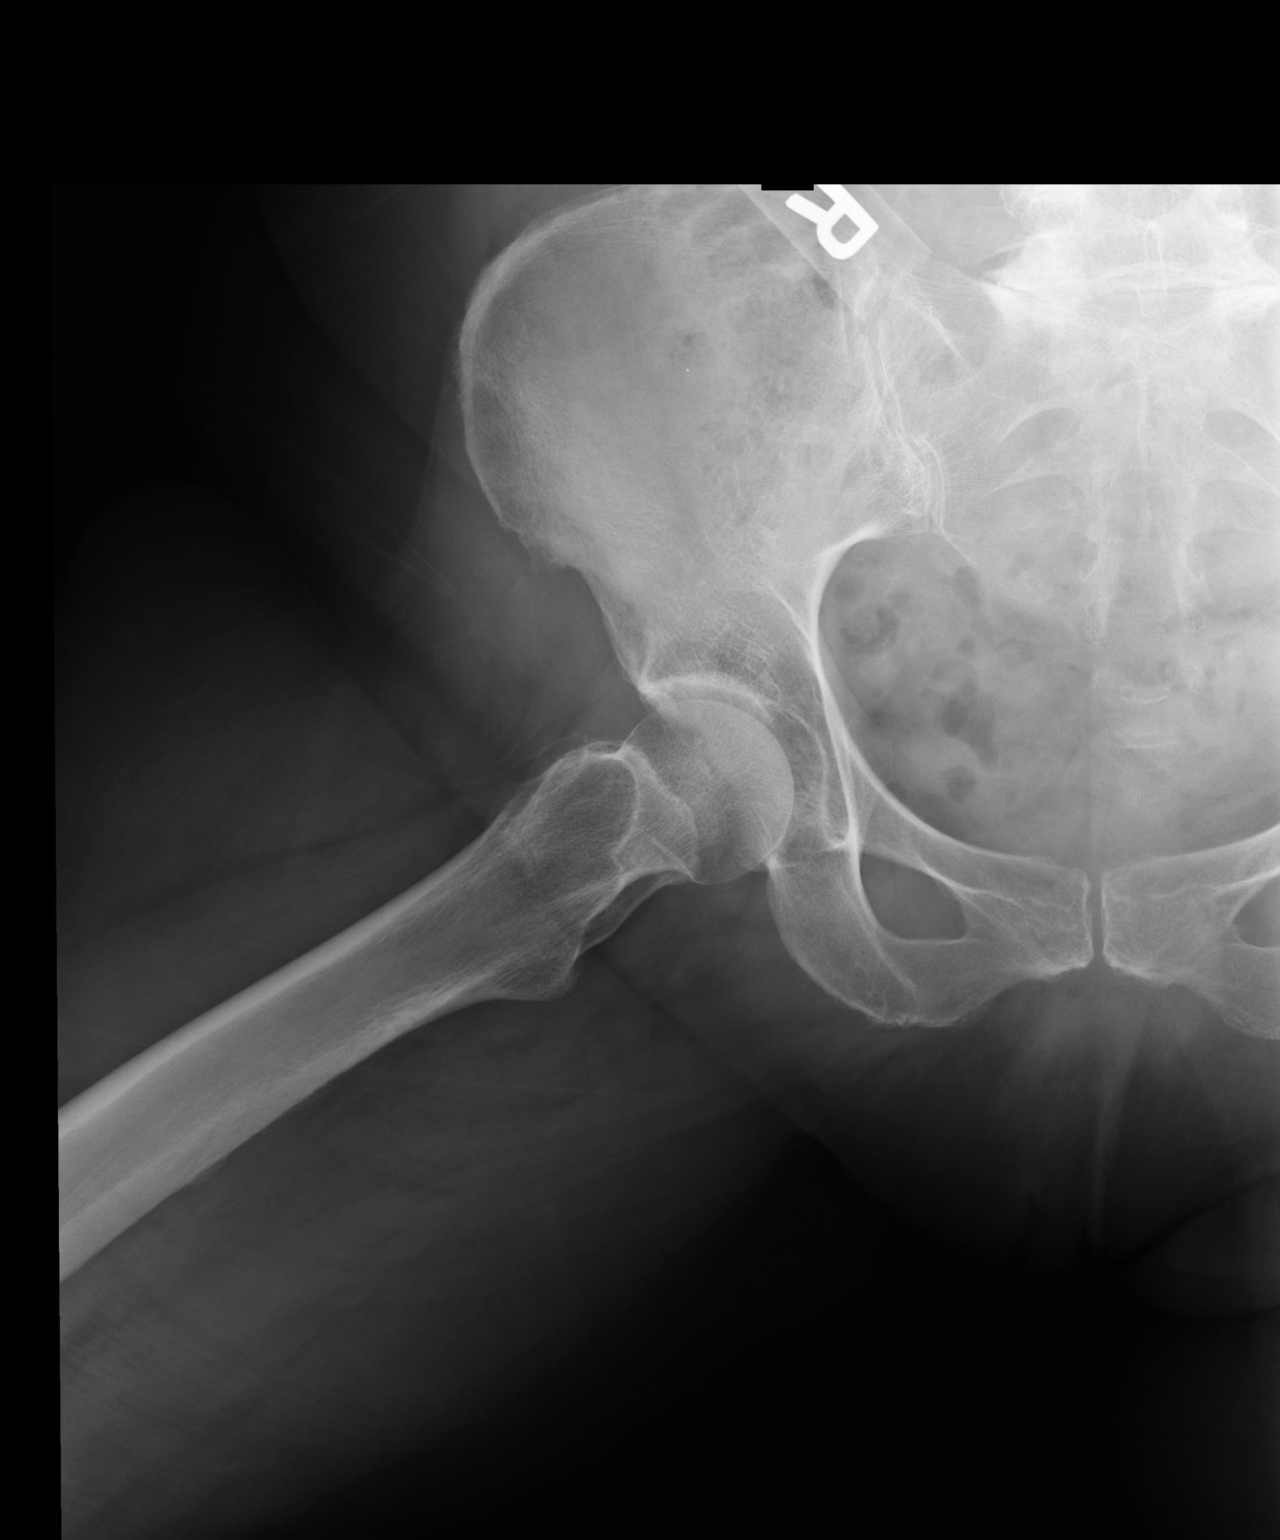
[im 3/3]
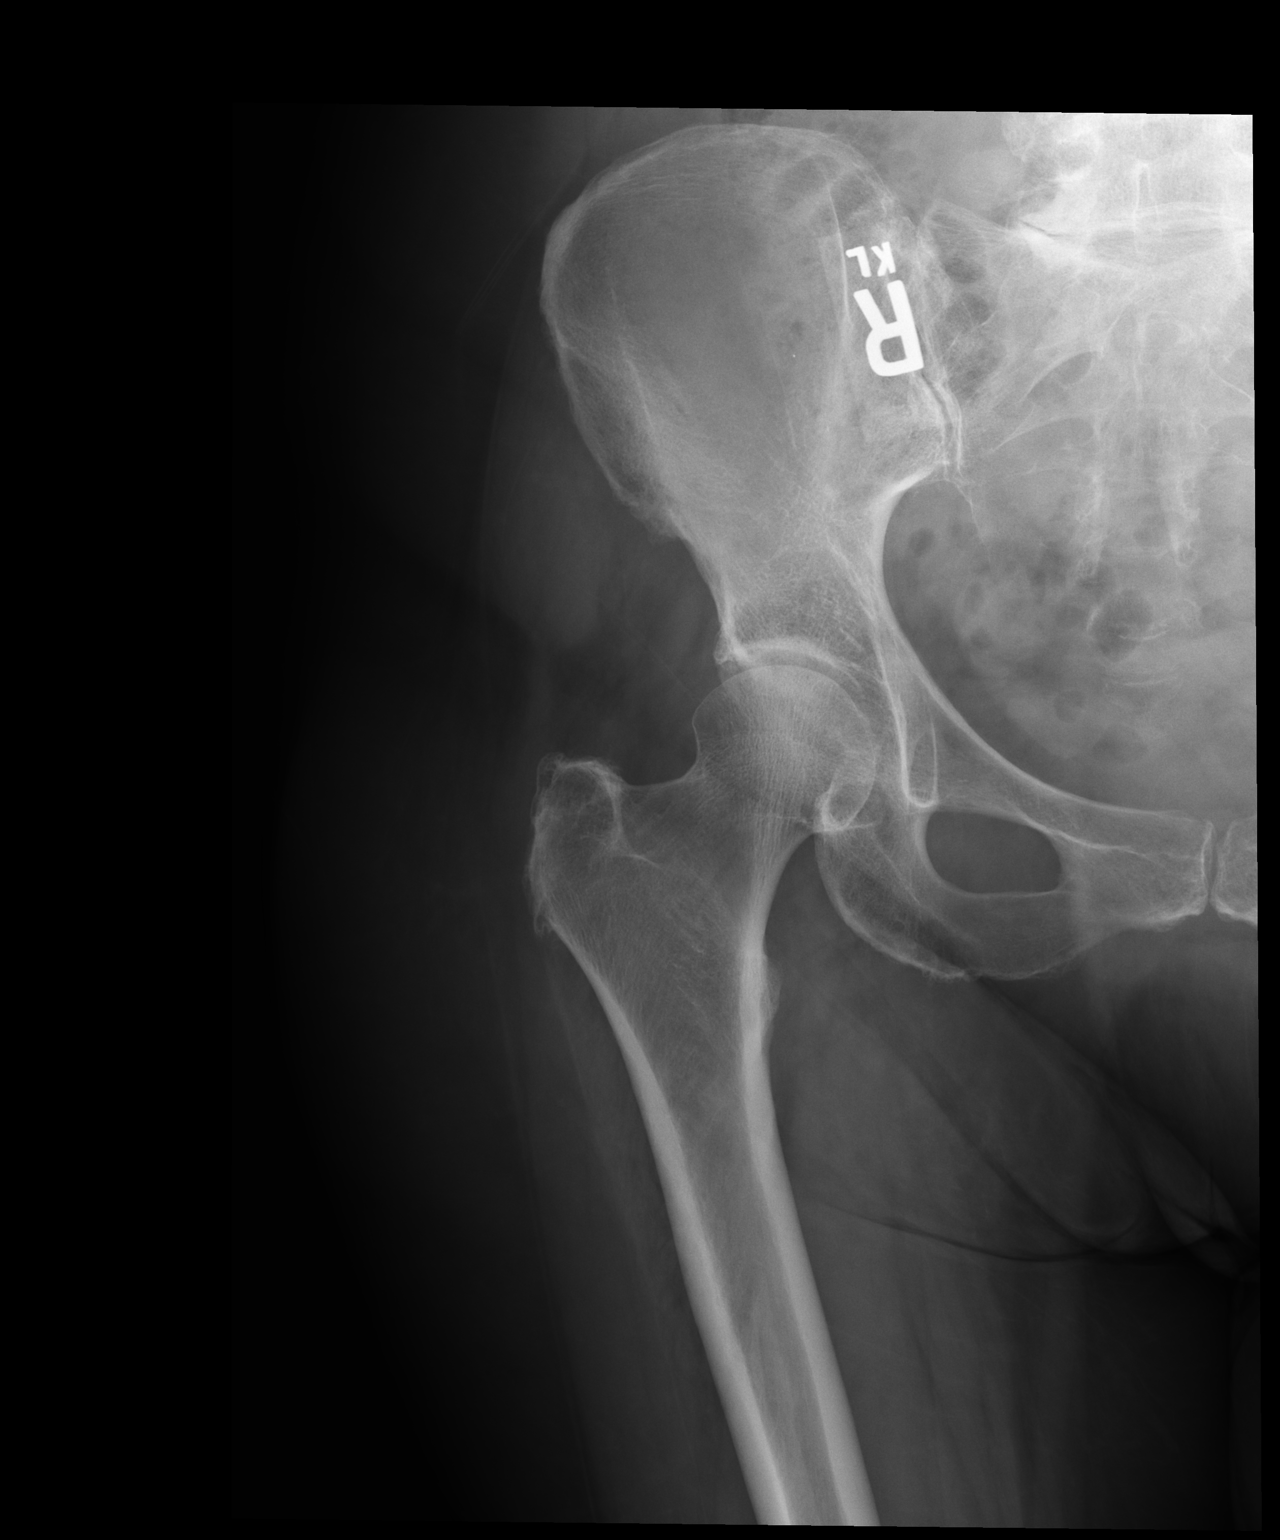

[3 of 3 positions shown; findings below may reference images not displayed]

FINDINGS: No fracture. Hip joint space heights are preserved. Mild degenerative 
change of the SI joints. Multifocal degenerative change of the spine. No focal 
soft tissue swelling.
IMPRESSION: Hip joint space heights are preserved.  
Degenerative change of the SI joints/spine.

## 2022-05-28 IMAGING — MR MRI LUMBAR SPINE WITHOUT CONTRAST
4 of 8 series · 8 of 48 positions shown · IV contrast (gadolinium)
Comparison: Lumbar spine x-ray January 06, 2032.

________________________________________________________________________________________________ 
MRI LUMBAR SPINE WITHOUT CONTRAST, 05/28/2022 [DATE]: 
CLINICAL INDICATION: Right-sided low back pain with right-sided sciatica.
TECHNIQUE: Multiplanar, multiecho position MR images of the lumbar spine were 
performed without intravenous gadolinium enhancement. Patient was scanned on a 
1.5T magnet.

[Series 101: survey · axial · 10.0mm · 1.25mm/px · 1 of 10 slices shown]
[im 1/10]
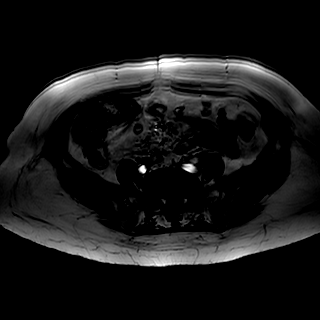

[Series 201: t2w_cor-surv · coronal · 6.0mm · 0.62mm/px · 1 of 13 slices shown]
[im 1/13]
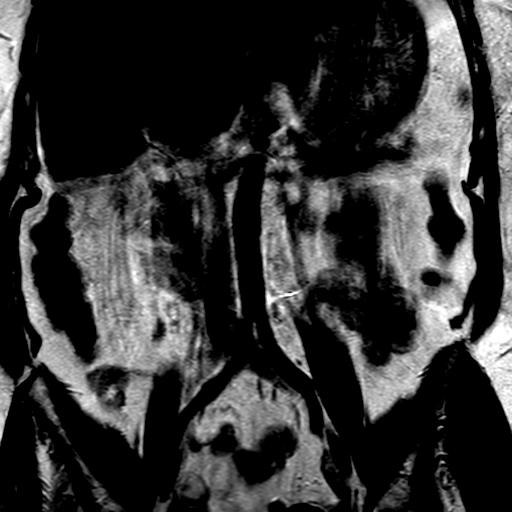

[Series 301: t1_tse_sag · sagittal · 4.0mm · 0.46mm/px · 3 of 18 slices shown]
[im 1/18]
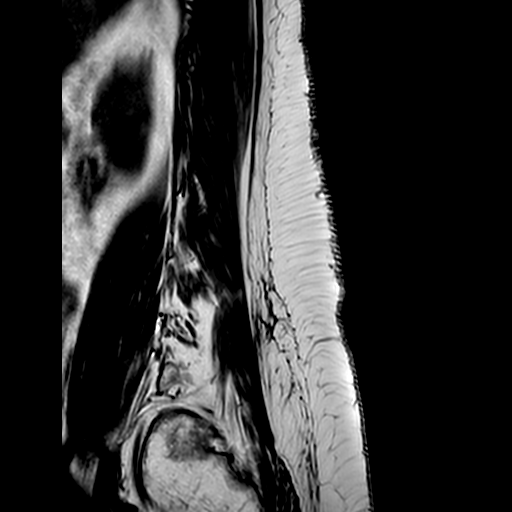
[im 9/18]
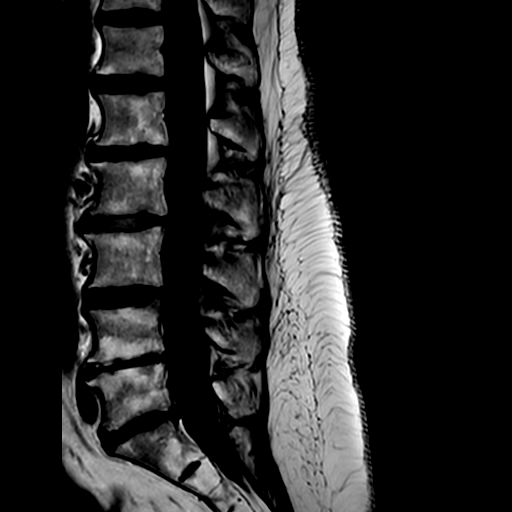
[im 18/18]
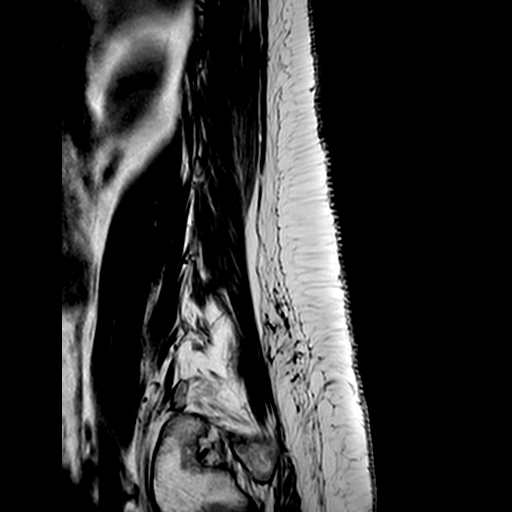

[Series 402: (id) view_ax mpr · axial · 1.0mm · 0.25mm/px · z∈[-32,+133]mm · 3 of 116 slices shown]
[im 15/116]
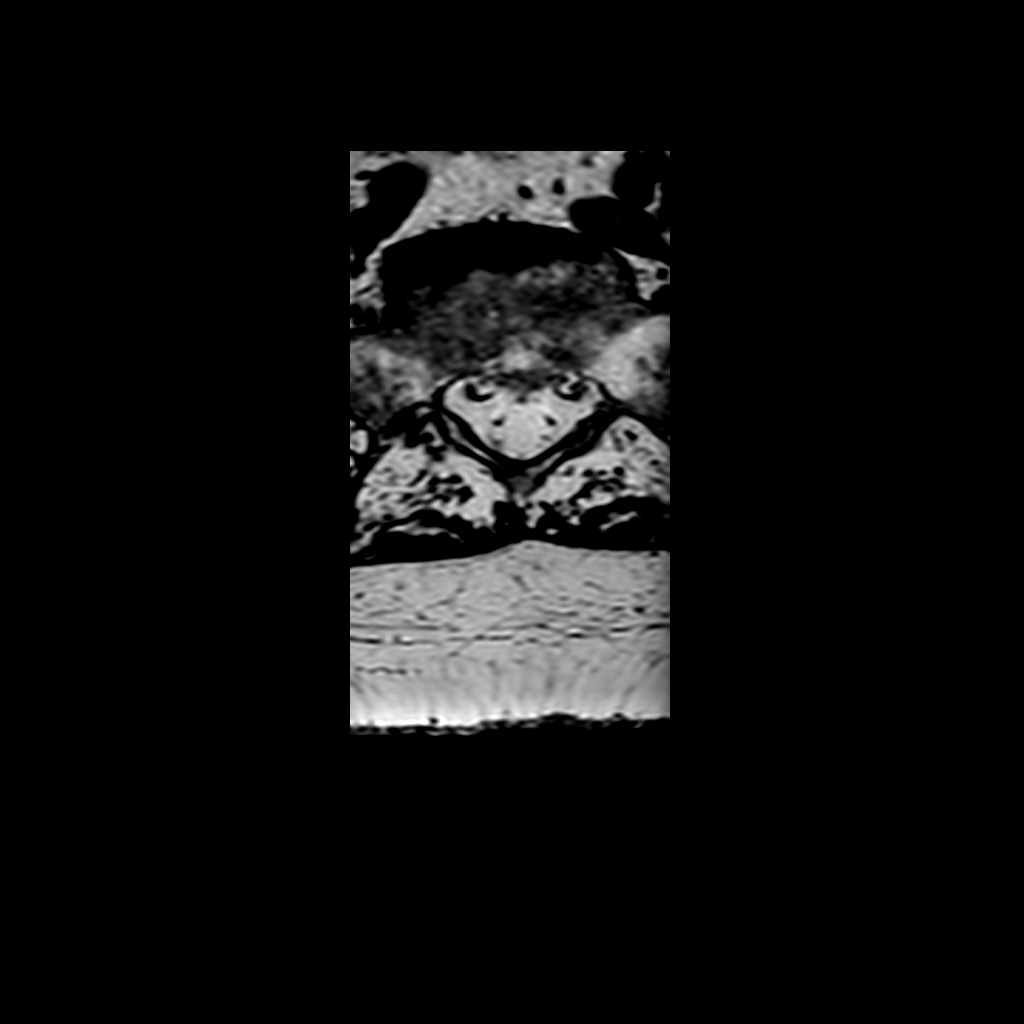
[im 58/116]
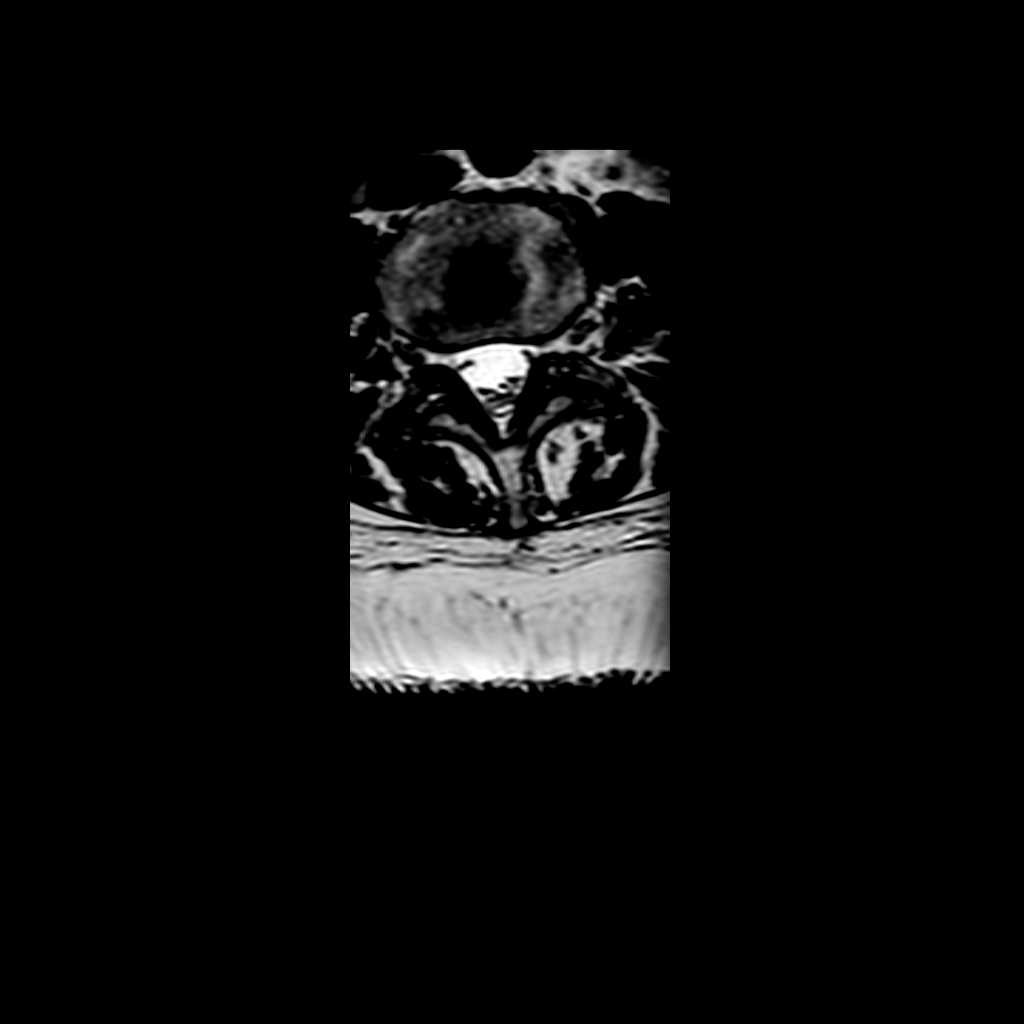
[im 101/116]
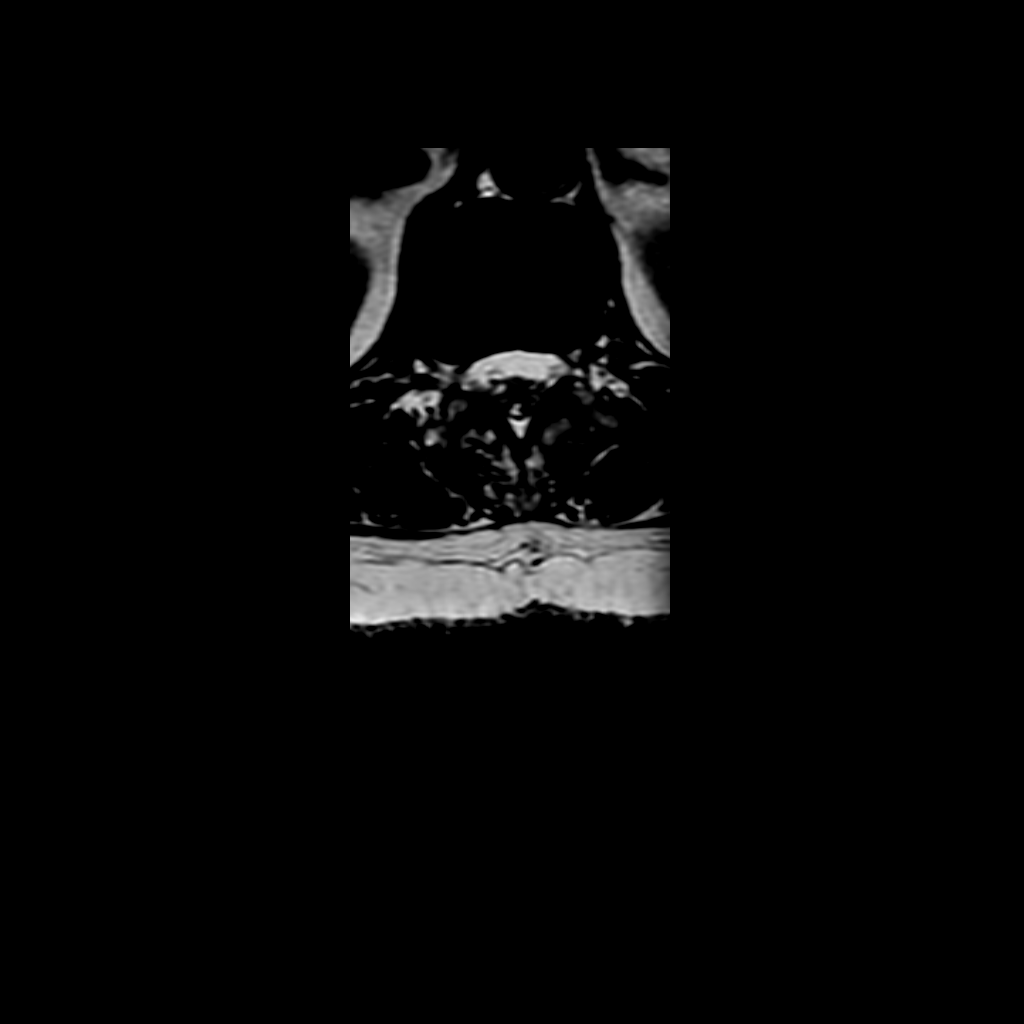

[8 of 48 positions shown; findings below may reference images not displayed]

FINDINGS: -------------------------------------------------------------------------------- 
------ 
GENERAL: 
Nomenclature is based on 5 lumbar type vertebral bodies.     
ALIGNMENT: Minimal dextroconvex lumbar scoliosis. Loss of the normal lumbar 
lordosis with trace grade 1 retrolisthesis L2 on L3 and L3 on L4. 
VERTEBRAL BODY HEIGHT: Normal.  
MARROW SIGNAL: No focal suspect signal abnormality. 
CORD SIGNAL: Normal distal spinal cord and cauda equina. Conus medullaris 
terminates at L1. 
ADDITIONAL FINDINGS: None. 
Modic I-II: L2-L3, L3-L4 and L4-L5 and L5-S1. 
Ligamentum Flavum > 2.5 mm: All levels. 
-------------------------------------------------------------------------------- 
------ 
SEGMENTAL: 
T12-L1: Loss of disc signal. Schmorls node. Otherwise normal. 
L1-L2: Mild loss of disc height and signal. Schmorls node. Anterior marginal 
osteophytes. Mild diffuse annular bulge. Mild facet arthropathy. Foramina 
patent. 
L2-L3: Mild loss of disc height and signal. Anterior vertebral osteophytes. 
Canal patent. Foramina patent. Mild facet arthropathy with small bilateral facet 
joint effusions. 
L3-L4: Loss of disc signal. Canal patent. Mild facet arthropathy. Mild right and 
left foraminal narrowing. 
L4-L5: Severe loss of disc height. Loss of disc signal. Marginal osteophytes. 
Minimal annular bulge. Canal patent. Mild facet arthropathy. Mild bilateral 
foraminal narrowing. 
L5-S1: Mild loss of disc height and signal. Canal patent. Mild diffuse annular 
bulge. Mild right foraminal narrowing. Left foramen is patent. Facet 
arthropathy. 
-------------------------------------------------------------------------------- 
------
IMPRESSION: Mild degenerative and scoliotic changes throughout the lumbar spine as detailed 
above.

## 2022-08-29 IMAGING — DX KNEE 3 VIEWS RIGHT
1 series · 3 of 3 positions shown · non-contrast
Comparison: None.

________________________________________________________________________________________________ 
KNEE 3 VIEWS RIGHT, KNEE 3 VIEWS LEFT, 08/29/2022 [DATE]: 
CLINICAL INDICATION: Bilateral knee pain..

[Series 1: ap int rot · U · 0.14mm/px · 3 of 3 slices shown]
[im 1/3]
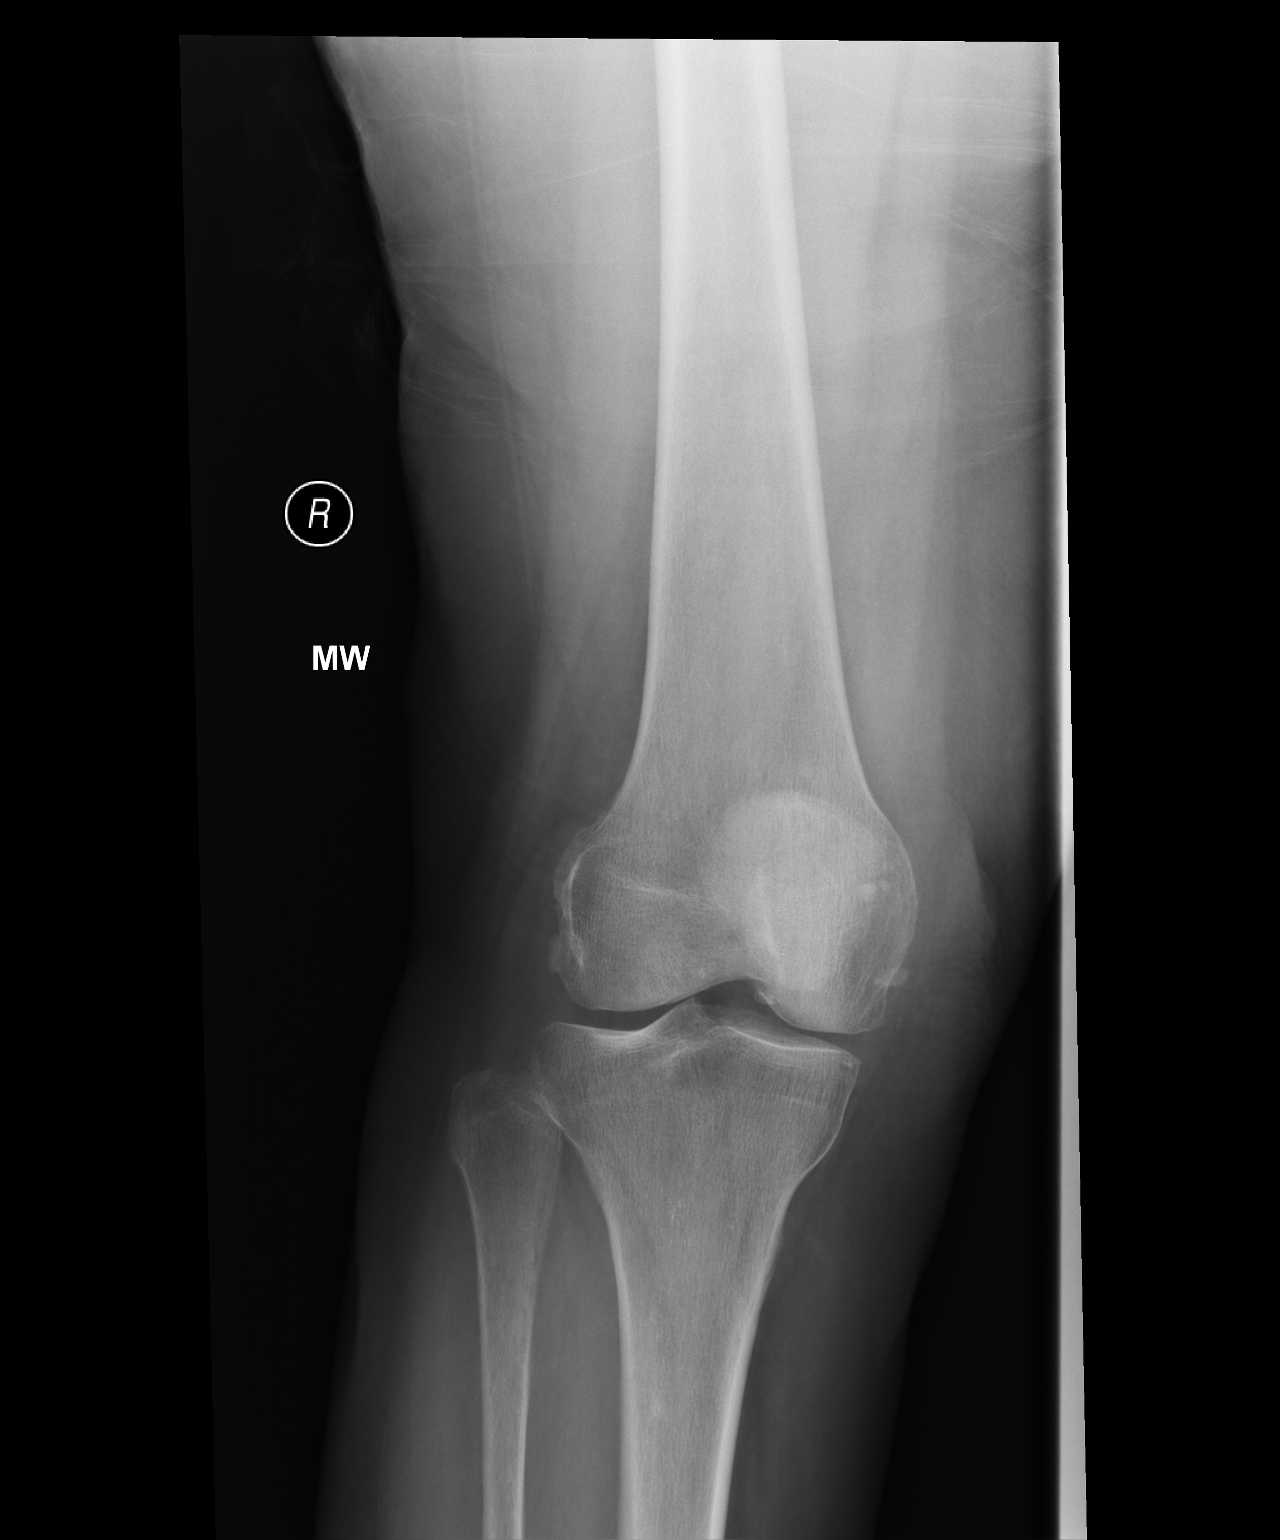
[im 2/3]
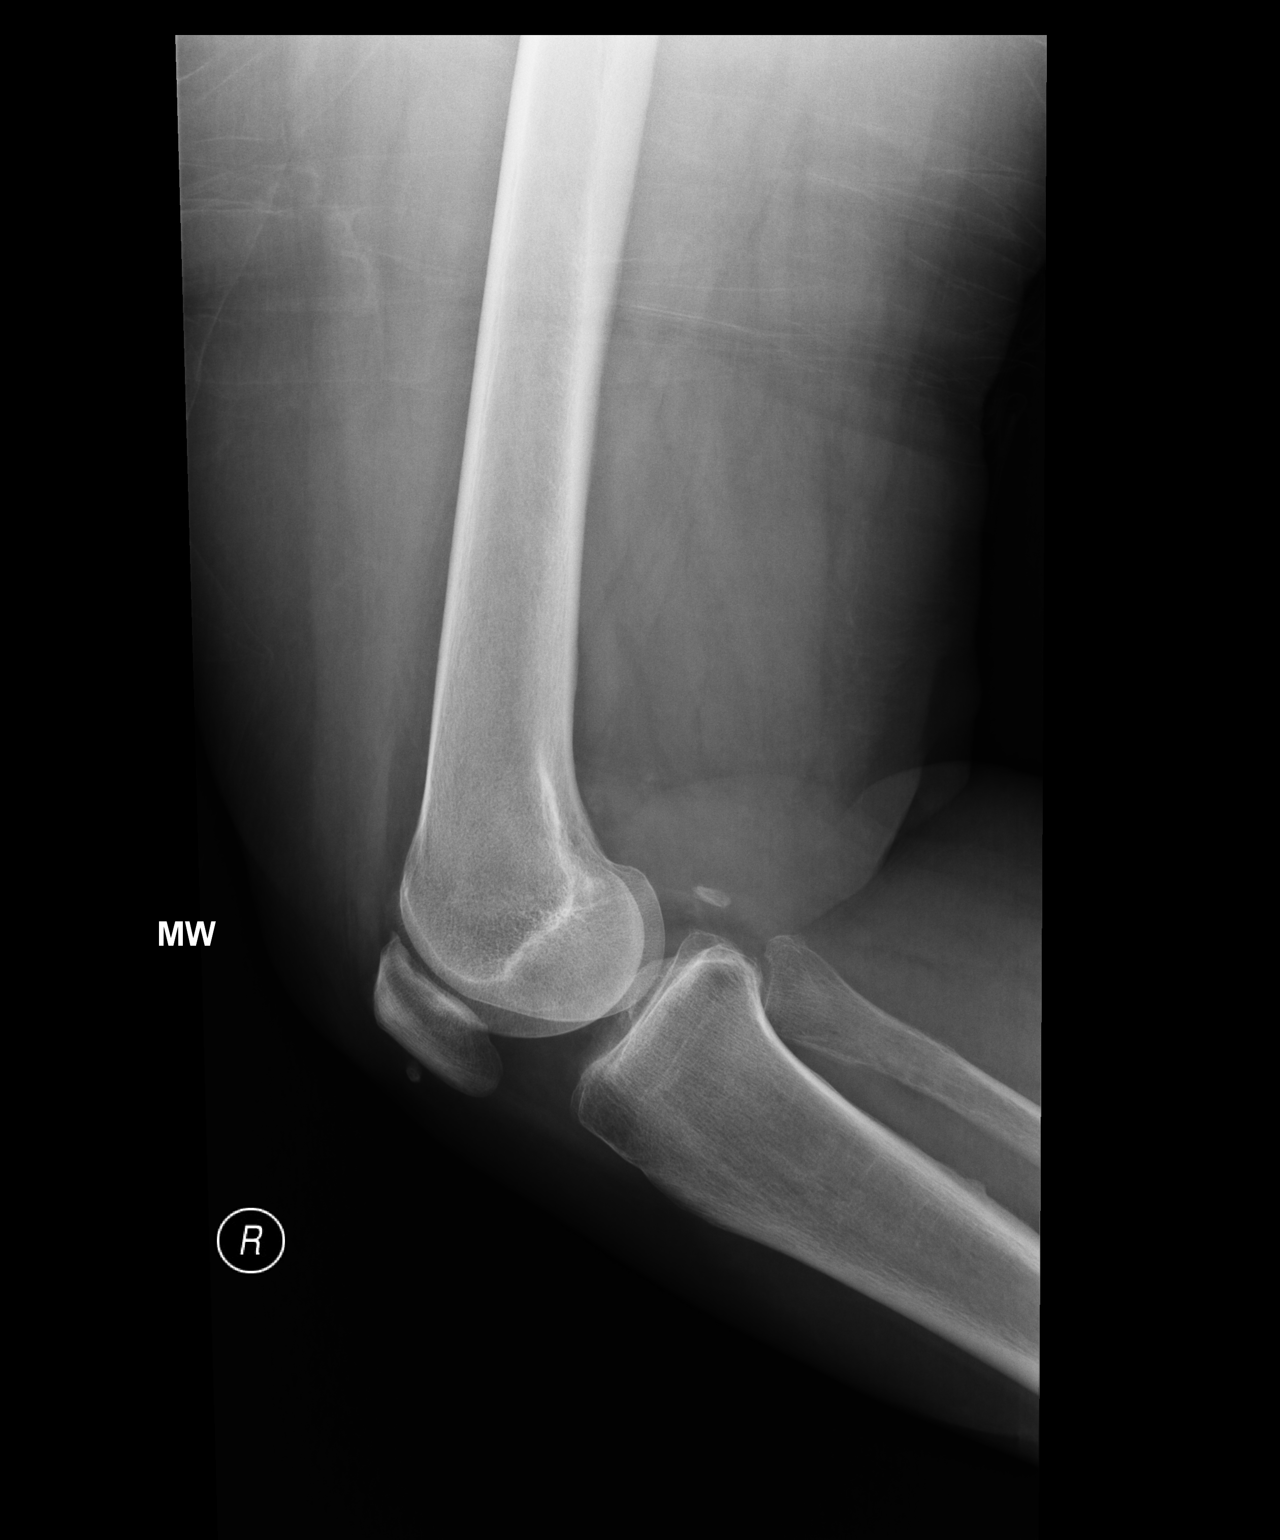
[im 3/3]
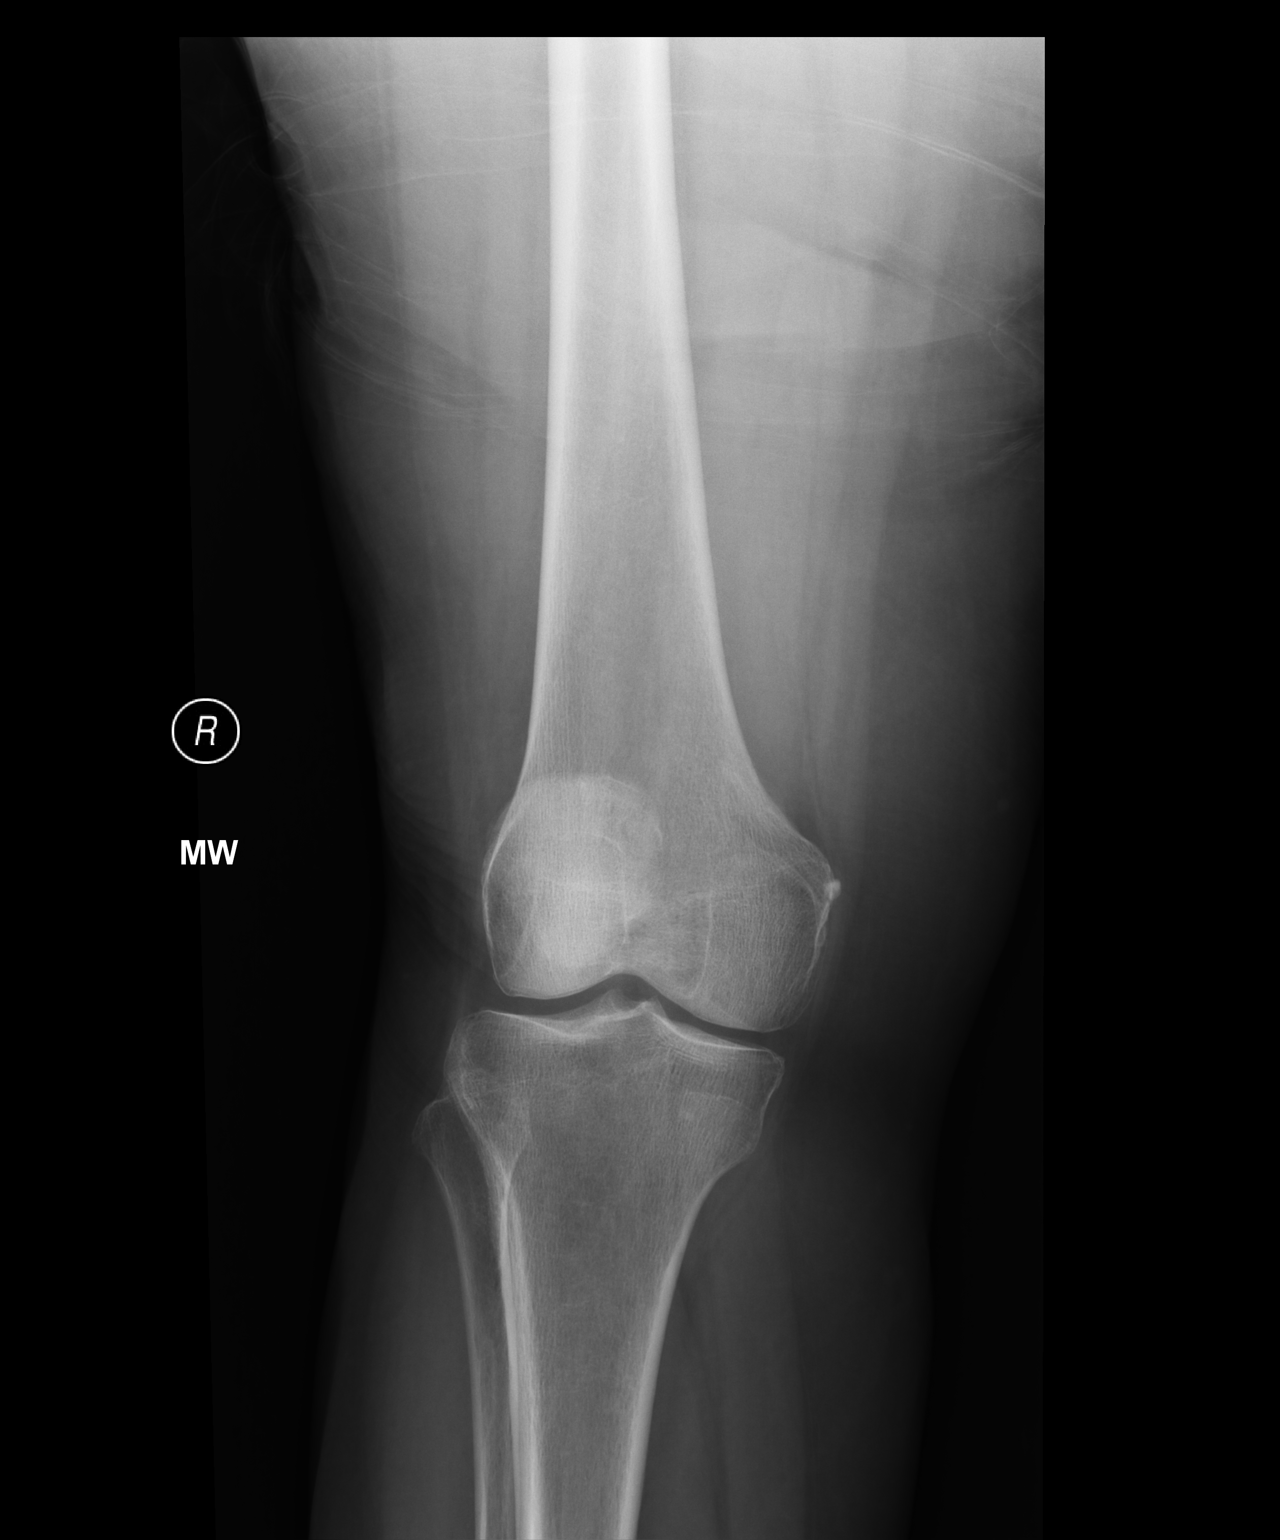

[3 of 3 positions shown; findings below may reference images not displayed]

FINDINGS: There is left greater than right medial and patellofemoral 
compartment joint space narrowing with mild marginal osteophytic spurring. 
Subcentimeter osteochondral body posterior left knee. No fracture. No effusion. 
Normal alignment.
IMPRESSION: Mild-to-moderate degenerative changes greater on the left.

## 2022-08-29 IMAGING — DX KNEE 3 VIEWS LEFT
1 series · 3 of 3 positions shown · non-contrast
Comparison: None.

________________________________________________________________________________________________ 
KNEE 3 VIEWS RIGHT, KNEE 3 VIEWS LEFT, 08/29/2022 [DATE]: 
CLINICAL INDICATION: Bilateral knee pain..

[Series 1: ap int rot · U · 0.14mm/px · 3 of 3 slices shown]
[im 1/3]
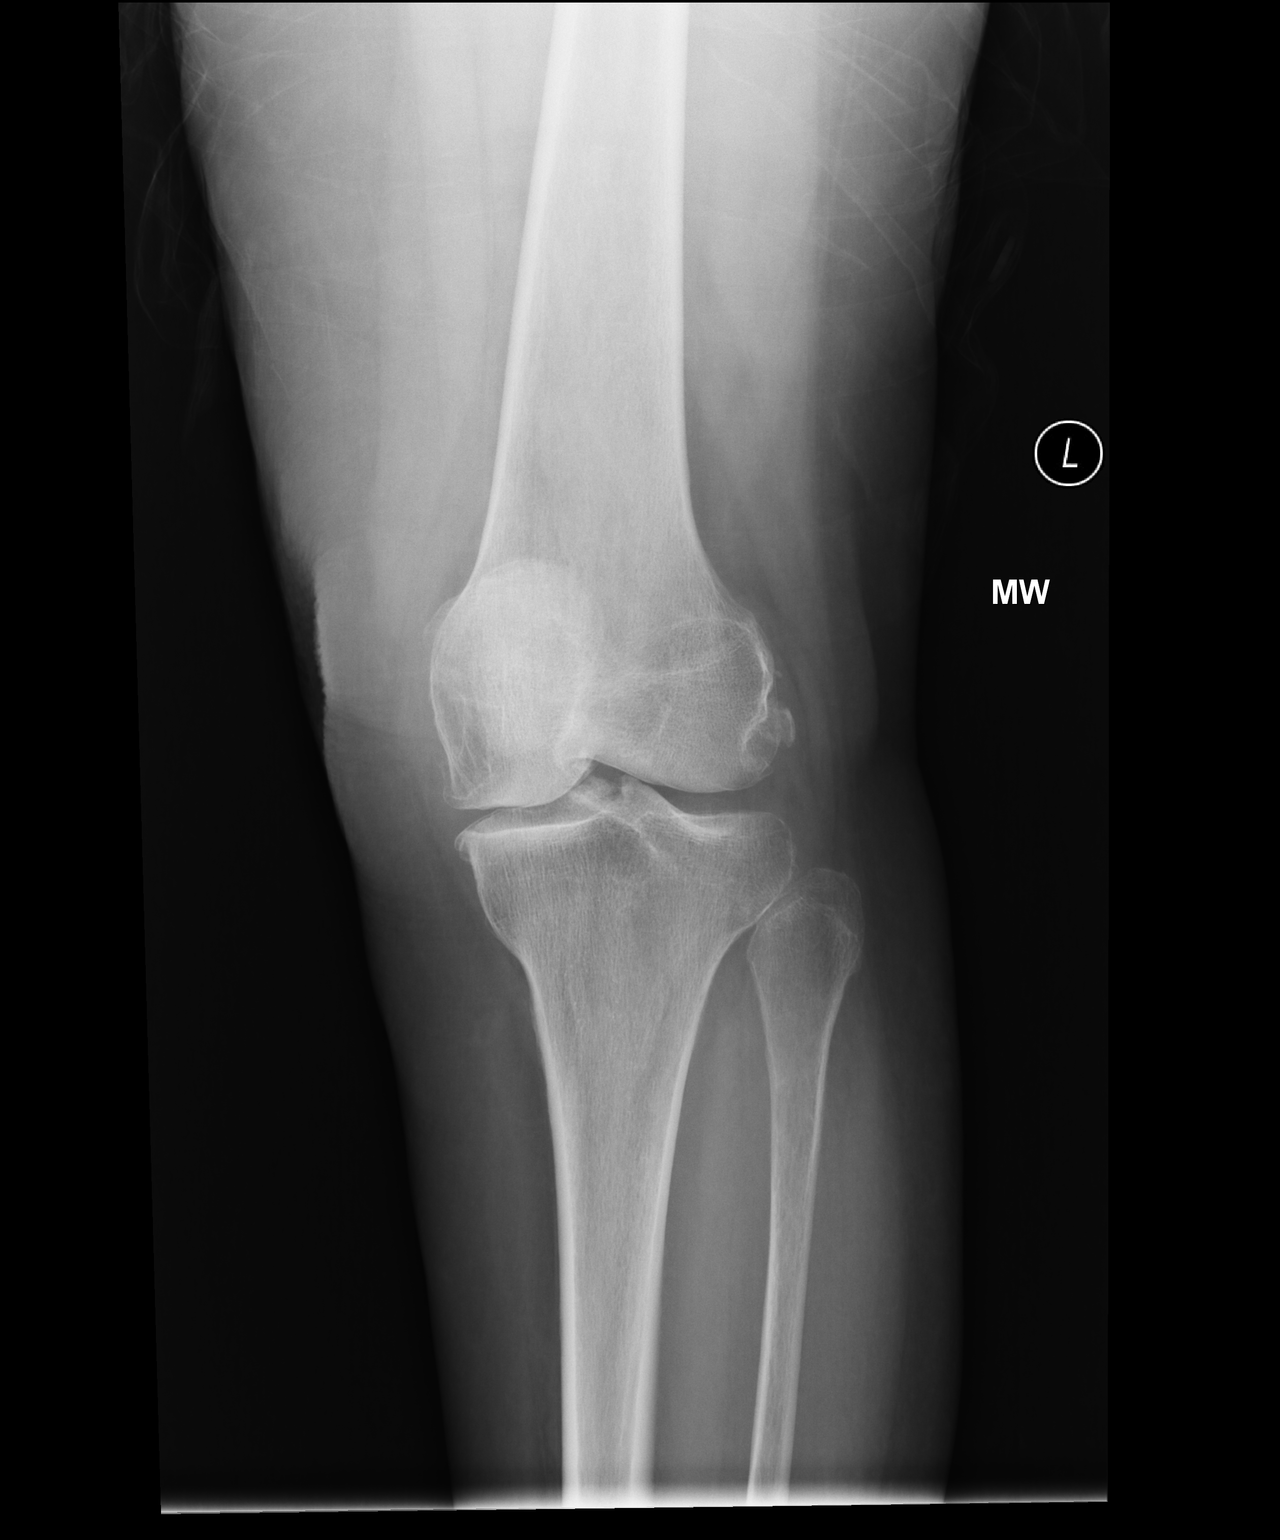
[im 2/3]
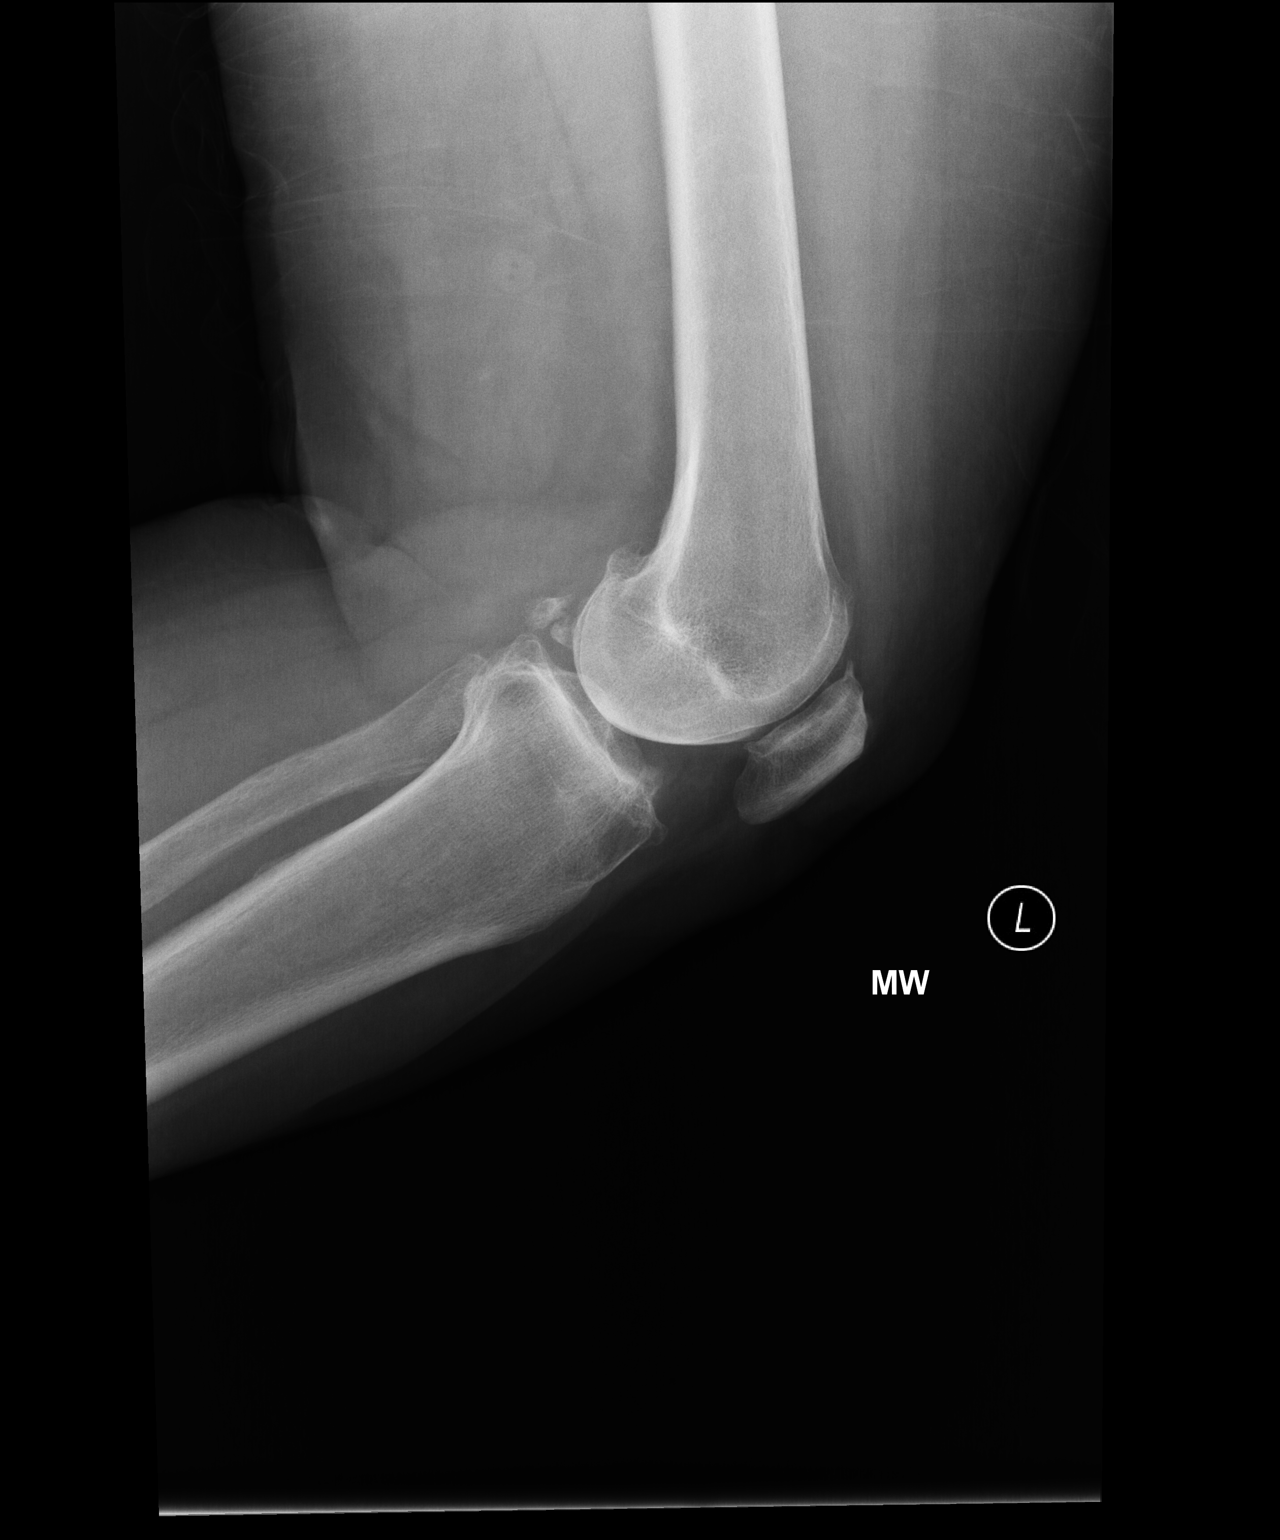
[im 3/3]
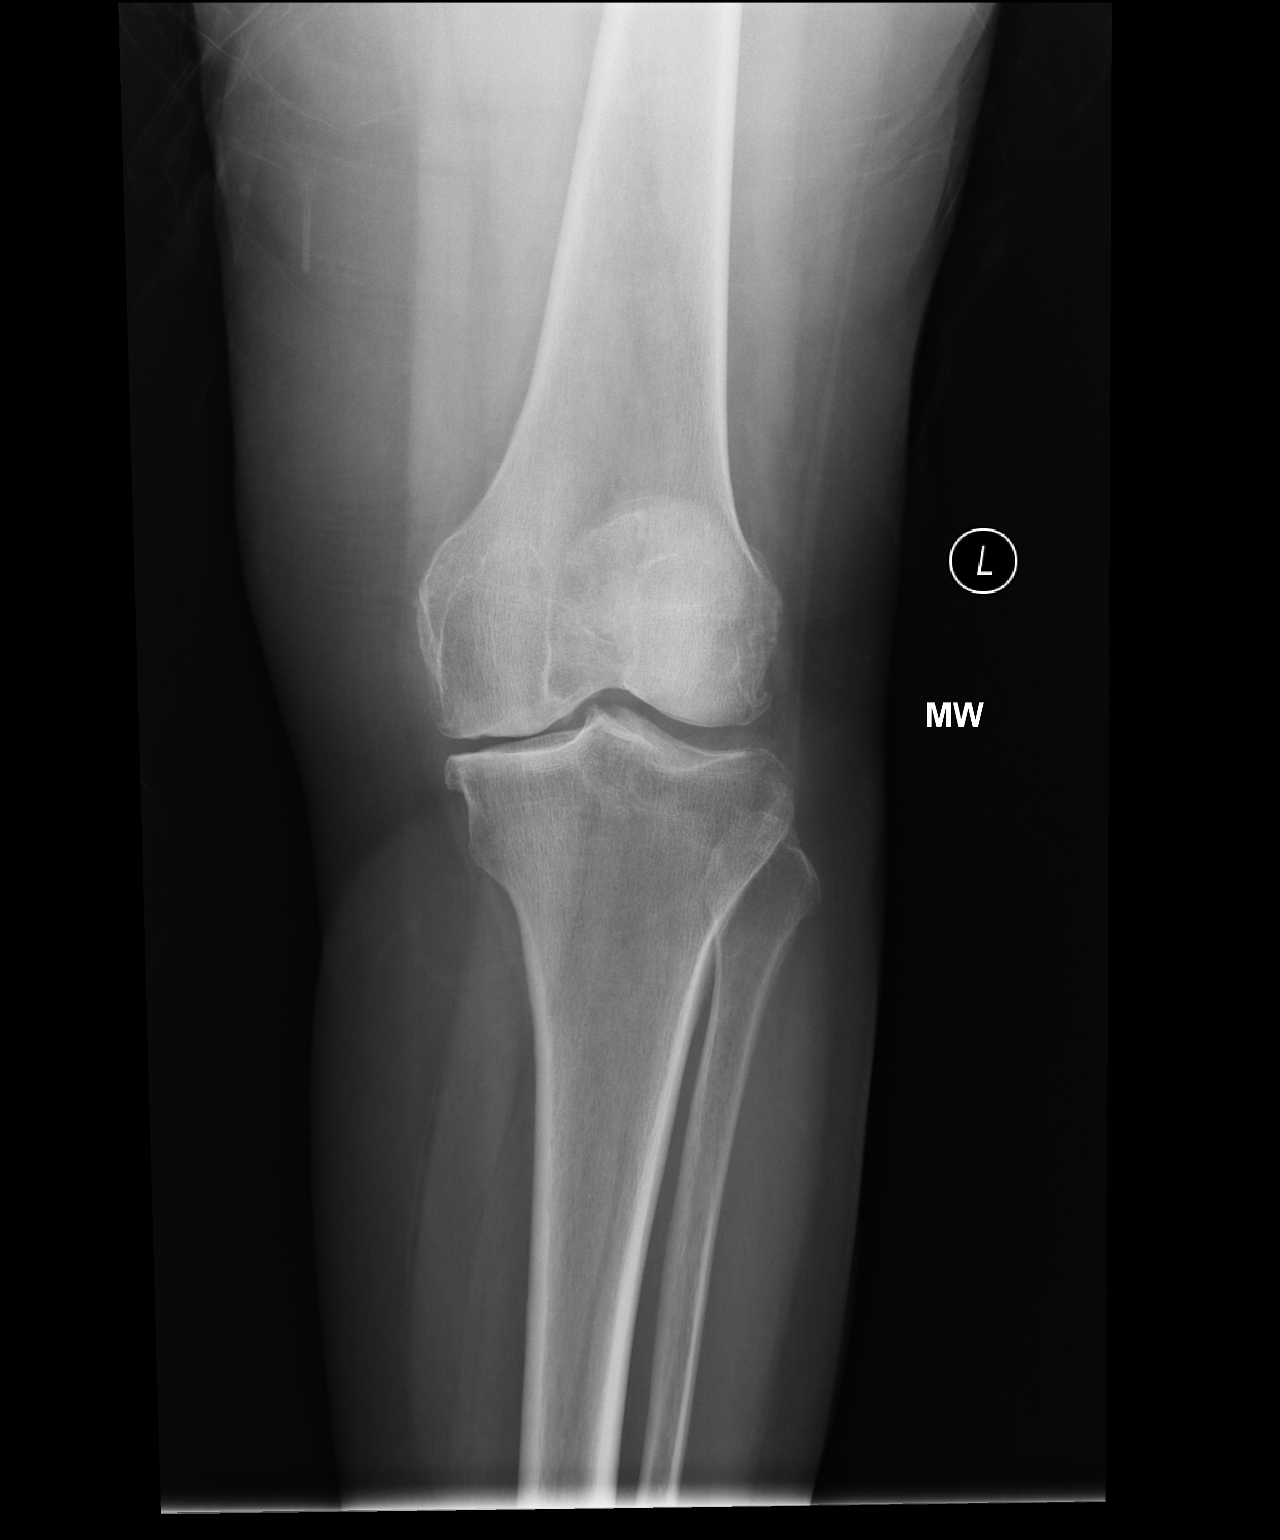

[3 of 3 positions shown; findings below may reference images not displayed]

FINDINGS: There is left greater than right medial and patellofemoral 
compartment joint space narrowing with mild marginal osteophytic spurring. 
Subcentimeter osteochondral body posterior left knee. No fracture. No effusion. 
Normal alignment.
IMPRESSION: Mild-to-moderate degenerative changes greater on the left.

## 2022-12-15 IMAGING — MR MRI THORACIC SPINE WITHOUT CONTRAST
6 of 8 series · 19 of 48 positions shown · IV contrast (gadolinium)
Comparison: MRI lumbar spine May 28, 2022 and lumbar spine x-rays January 05, 2022.

________________________________________________________________________________________________ 
MRI THORACIC SPINE WITHOUT CONTRAST, 12/15/2022 [DATE]: 
CLINICAL INDICATION: Right-sided thoracic spine pain. No trauma. Spondylosis
TECHNIQUE: Multiplanar, multiecho position MR images of the thoracic spine were 
performed without intravenous gadolinium enhancement. Patient was scanned on a 
3T magnet.

[Series 102: T1 · sagittal · 5.5mm · 0.87mm/px · 2 of 15 slices shown]
[im 1/15]
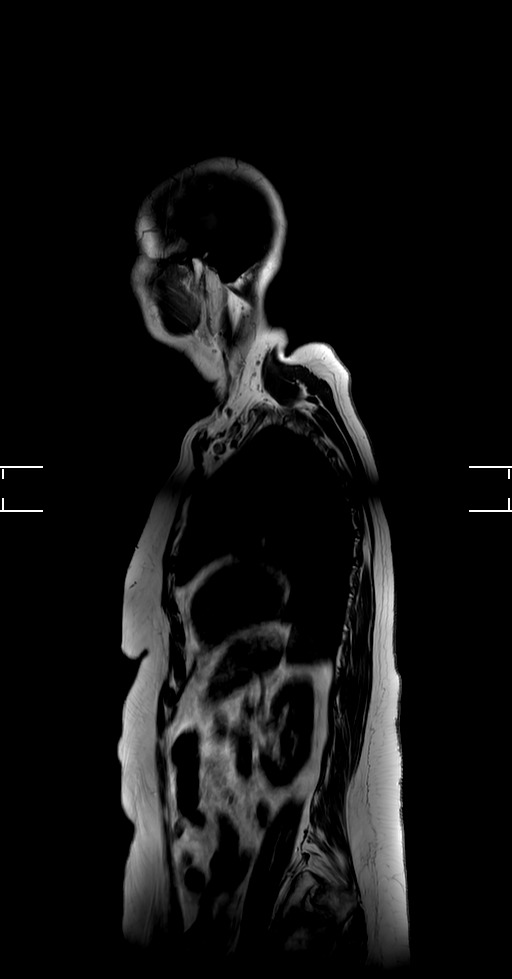
[im 15/15]
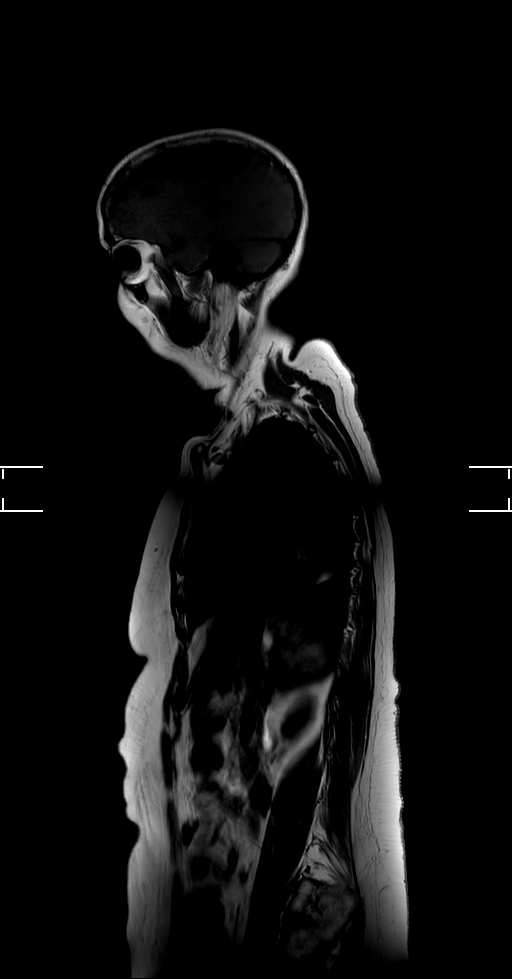

[Series 201: t2w_tse cor · coronal · 6.0mm · 0.51mm/px · 1 of 11 slices shown]
[im 1/11]
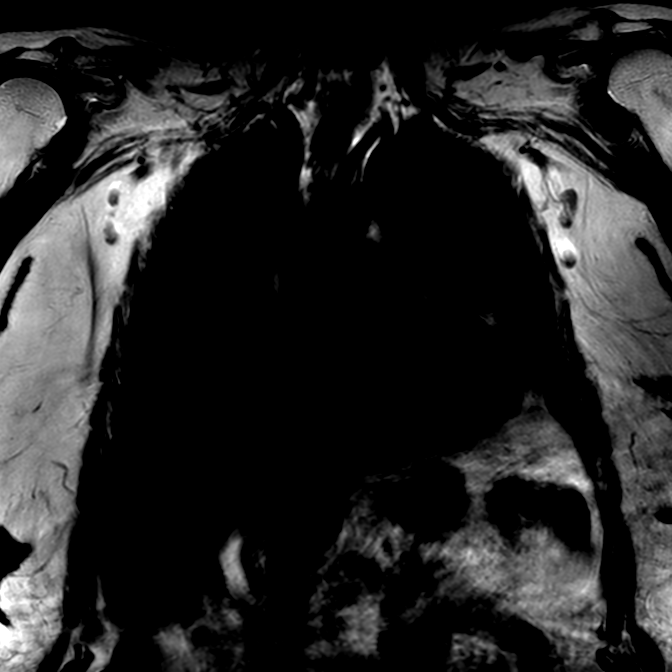

[Series 301: t1w_tse sag · sagittal · 3.0mm · 0.58mm/px · 3 of 21 slices shown]
[im 1/21]
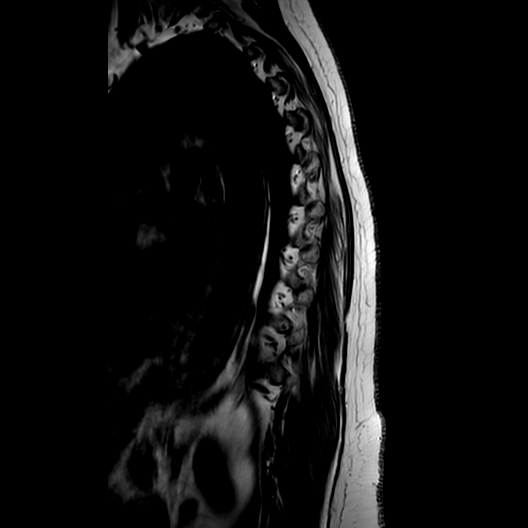
[im 11/21]
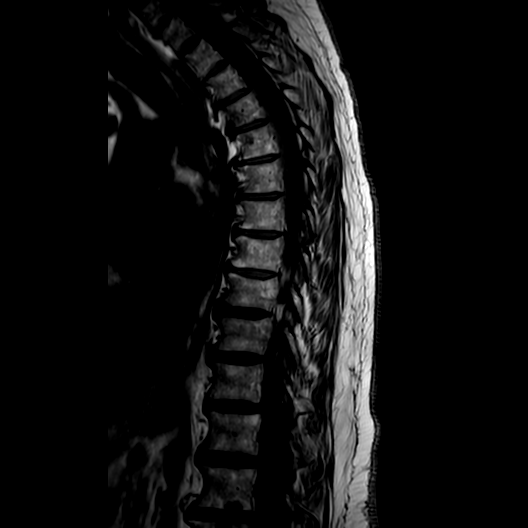
[im 21/21]
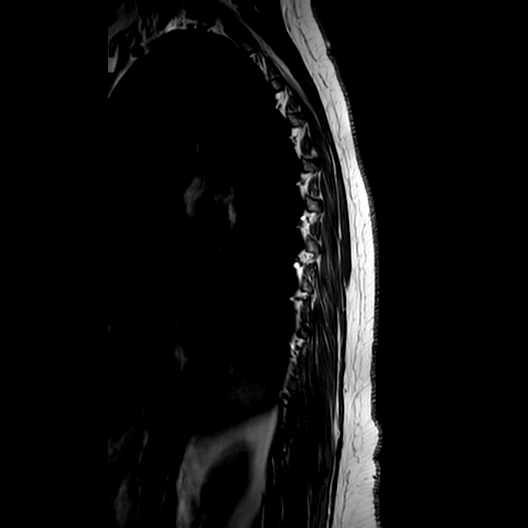

[Series 401: t2w_tse sag · sagittal · 3.0mm · 0.62mm/px · 3 of 21 slices shown]
[im 1/21]
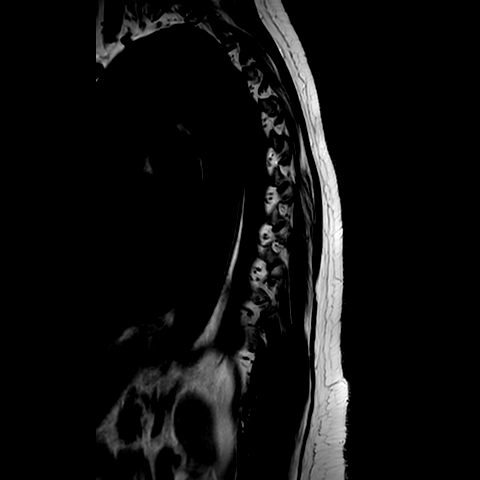
[im 11/21]
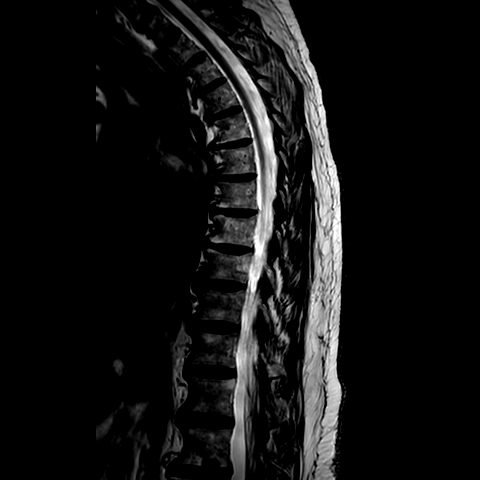
[im 21/21]
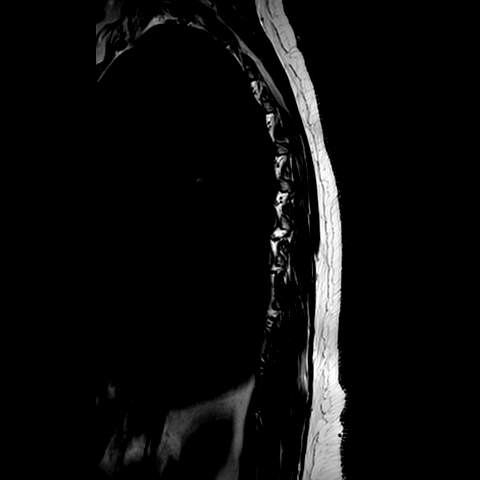

[Series 501: stir_longte · sagittal · 3.0mm · 0.69mm/px · 1 of 21 slices shown]
[im 1/21]
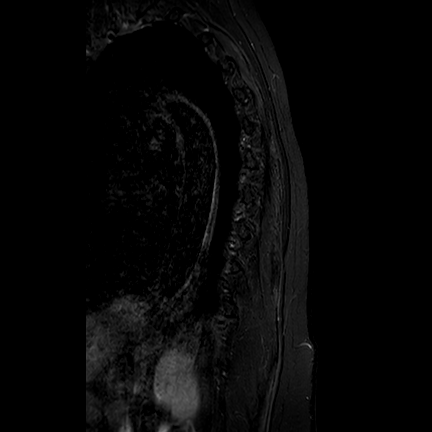

[Series 703: T2 · axial · 4.0mm · 0.42mm/px · z∈[-296,-37]mm · 9 of 66 slices shown]
[im 1/66]
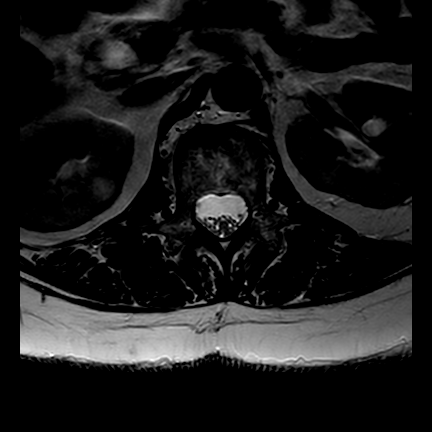
[im 9/66]
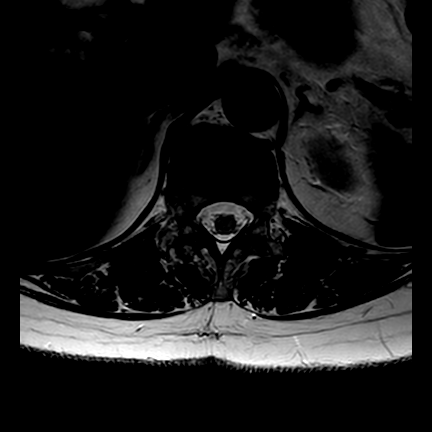
[im 17/66]
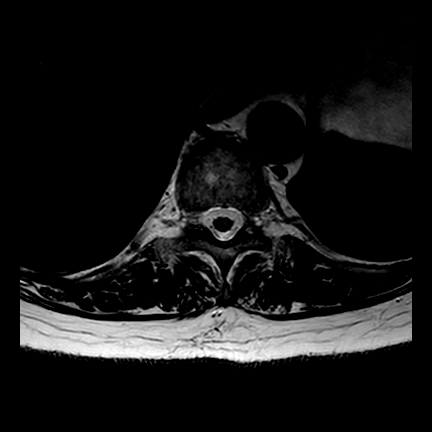
[im 25/66]
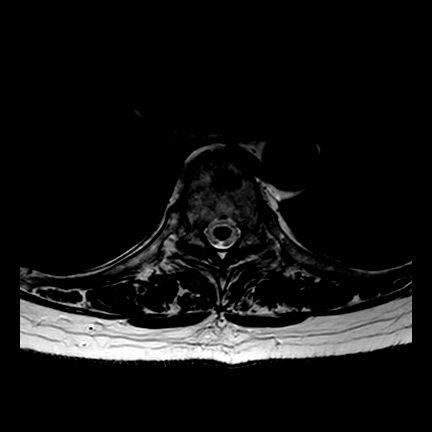
[im 33/66]
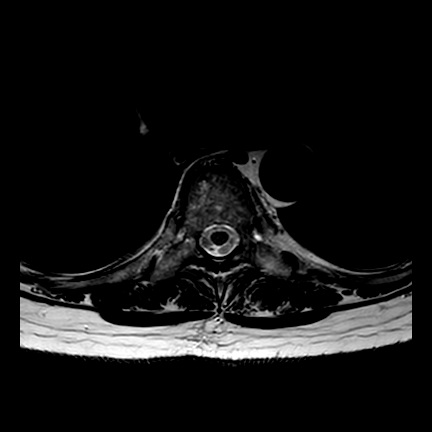
[im 41/66]
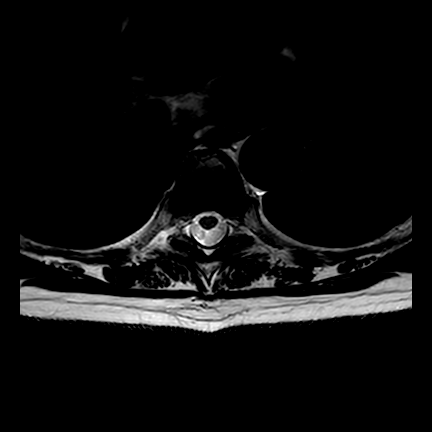
[im 49/66]
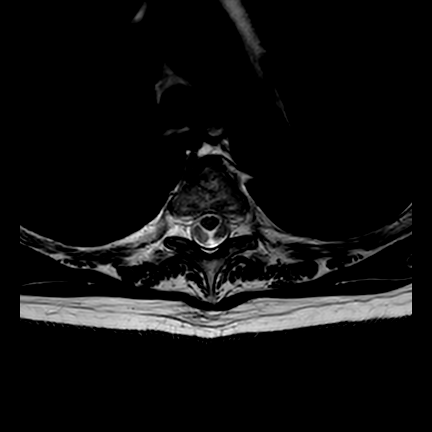
[im 57/66]
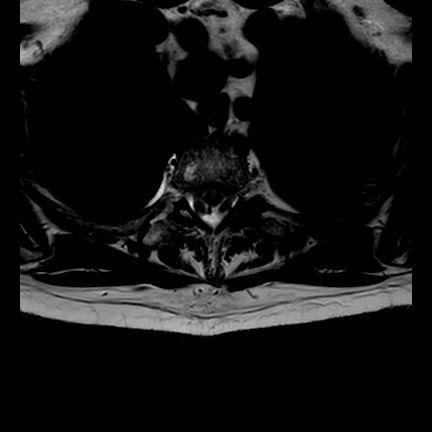
[im 66/66]
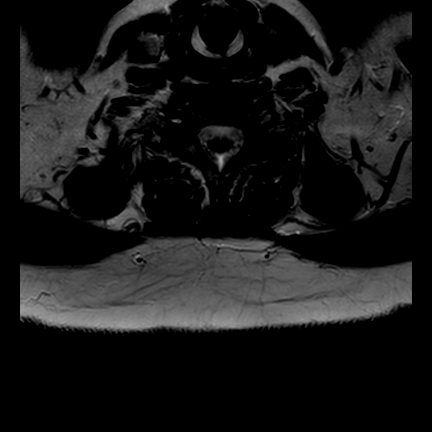

[19 of 48 positions shown; findings below may reference images not displayed]

FINDINGS: Sagittal localizer demonstrates 12 thoracic and 5 lumbar type vertebral bodies. 
Cervical and lumbar spondylosis is noted. 
-------------------------------------------------------------------------------- 
------ 
GENERAL: 
ALIGNMENT: Normal coronal alignment. Slightly accentuated thoracic kyphosis with 
otherwise anatomic sagittal alignment. 
VERTEBRAL BODY HEIGHT: Normal.  
MARROW SIGNAL: No focal suspect signal abnormality. T9 and T10 hemangiomas. 
CORD SIGNAL: Normal. 
ADDITIONAL FINDINGS: Extra hepatic biliary dilatation measures up to 10 mm. 
Bilateral renal cysts. Nodular structure measuring 9 mm in the right hilum on 
image 42 of series 703 could be artifactual, although pulmonary nodule would be 
difficult to exclude. Hepatic cyst. 
-------------------------------------------------------------------------------- 
------ 
RELEVANT SEGMENTAL (levels with severe stenosis or significant findings): 
Mild multilevel variable degrees of loss of disc height and loss of disc signal. 
No evidence of active facet arthritis. No critical or significant central canal 
narrowing. Schmorls node within the inferior endplates of T12 and L1. 
Additional scattered discogenic/degenerative changes are noted. 
-------------------------------------------------------------------------------- 
------
IMPRESSION: Mild thoracic degenerative changes detailed above. 
Nodular structure right hilum. This could be artifactual, although pulmonary 
nodule would be difficult to exclude. Follow-up chest CT recommended. Findings 
will be phoned to the referring clinicians office. 
Extra hepatic biliary dilatation. Correlate with liver function tests.

## 2023-01-08 IMAGING — DX HIP BILATERAL WITH PELVIS 5 VIEWS
1 series · 5 of 5 positions shown · non-contrast
Comparison: 01/05/2022

________________________________________________________________________________________________ 
HIP BILATERAL WITH PELVIS 5 VIEWS, 01/08/2023 [DATE]: 
CLINICAL INDICATION: Inflammatory Polyarthropathy, Pain In Unspecified Joint.

[Series 1: lateral · U · 0.14mm/px · 5 of 5 slices shown]
[im 1/5]
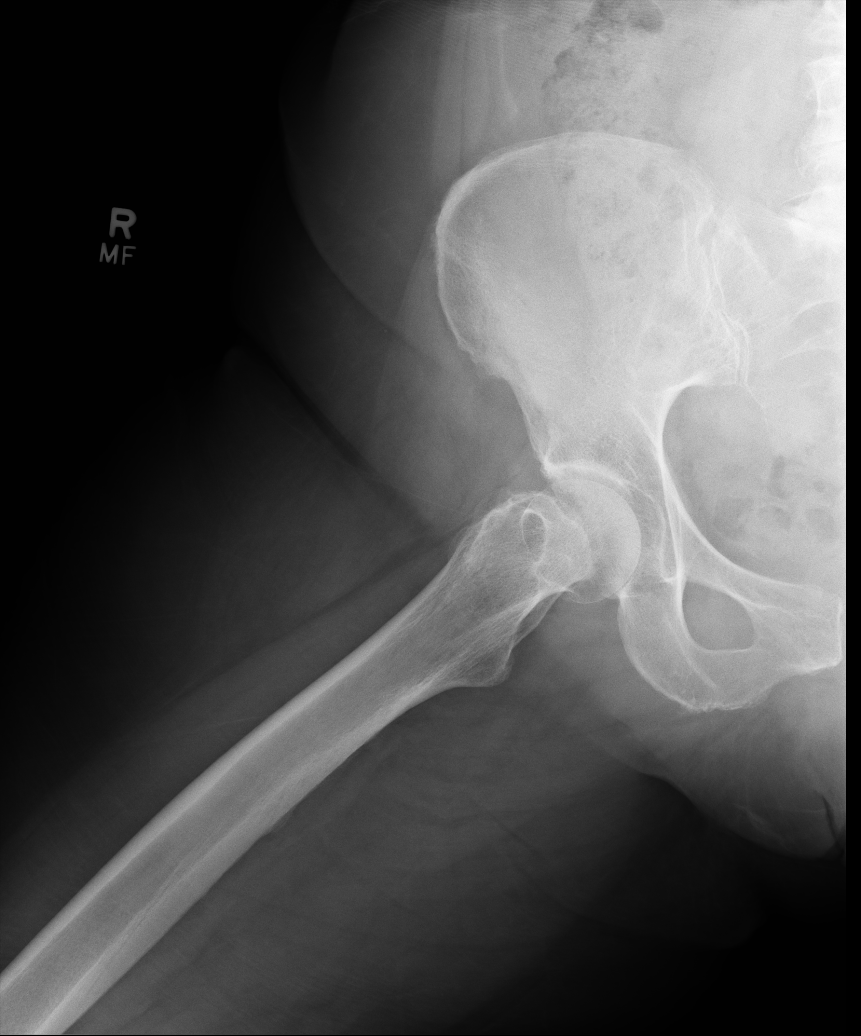
[im 2/5]
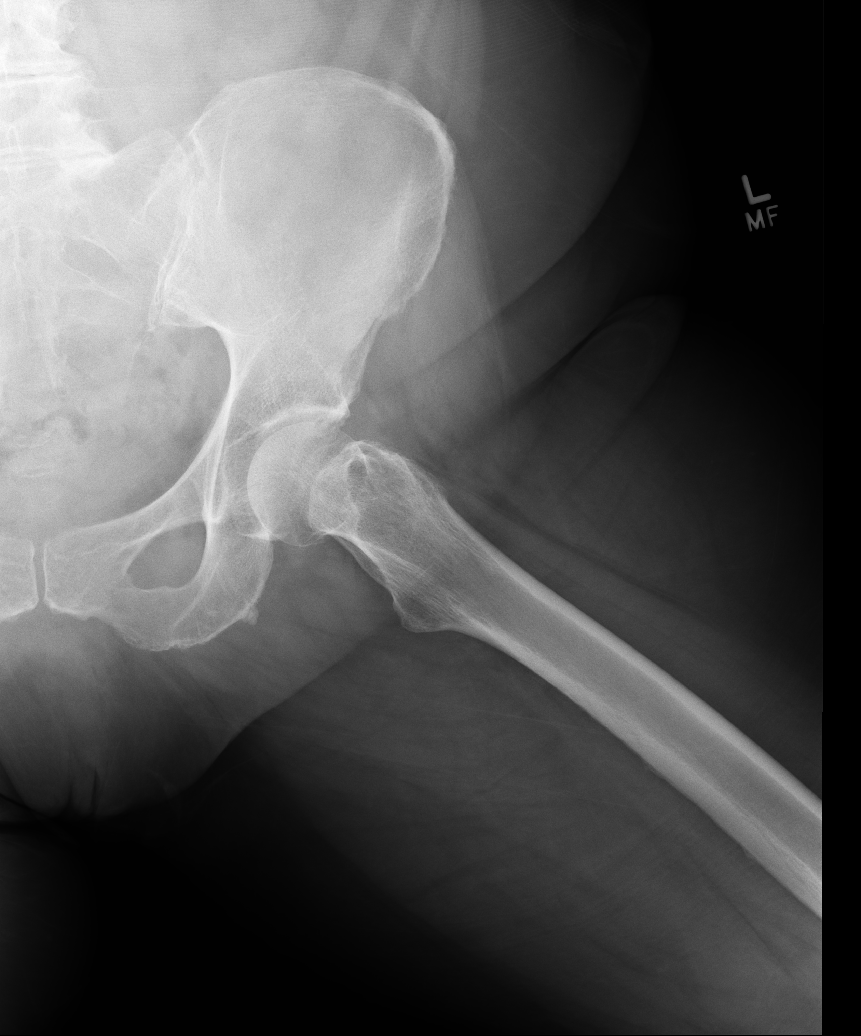
[im 3/5]
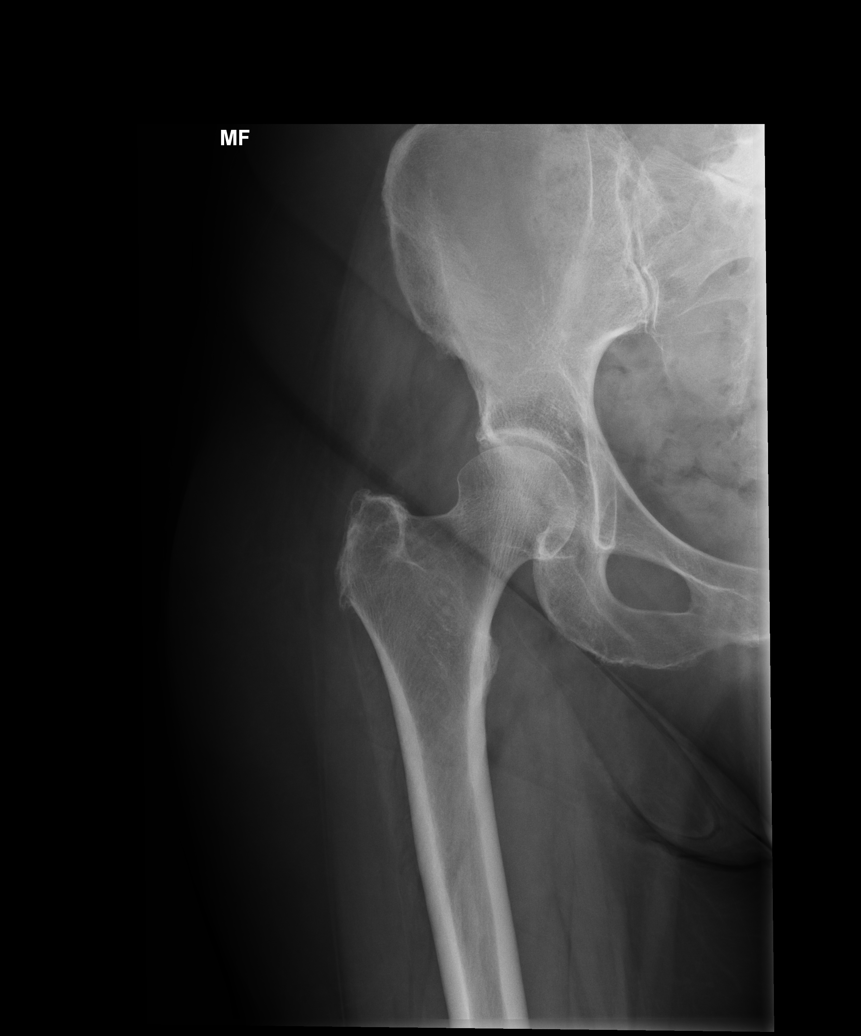
[im 4/5]
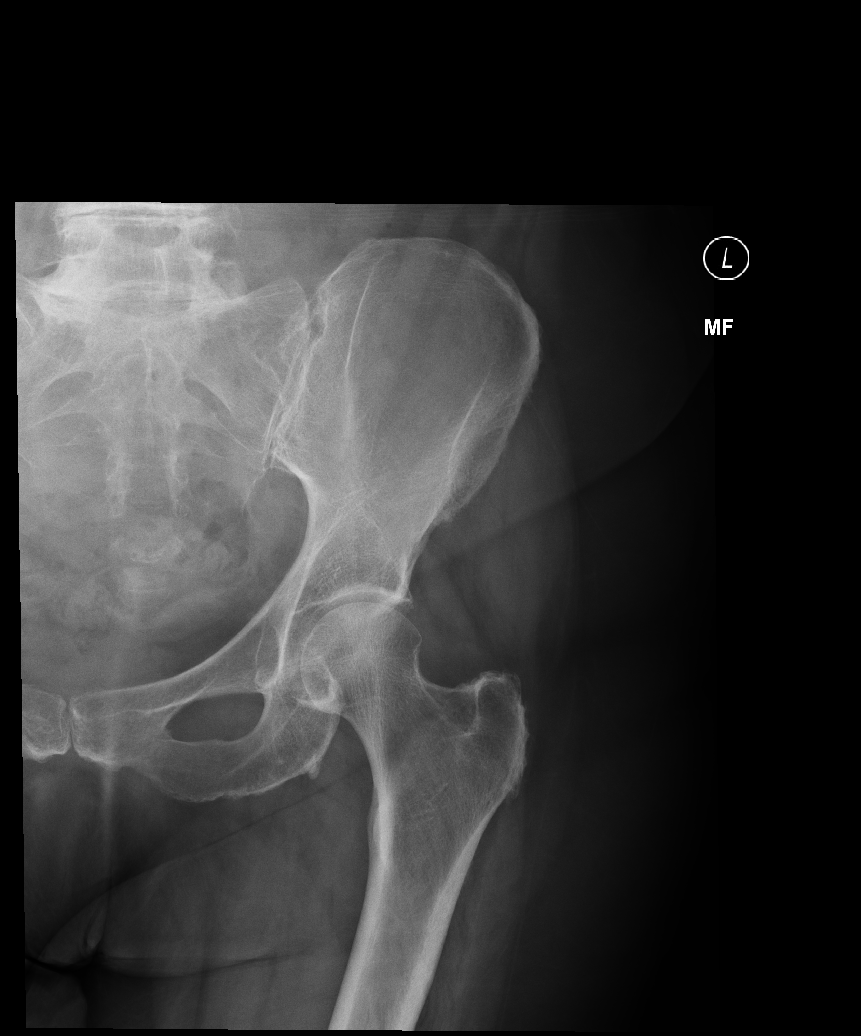
[im 5/5]
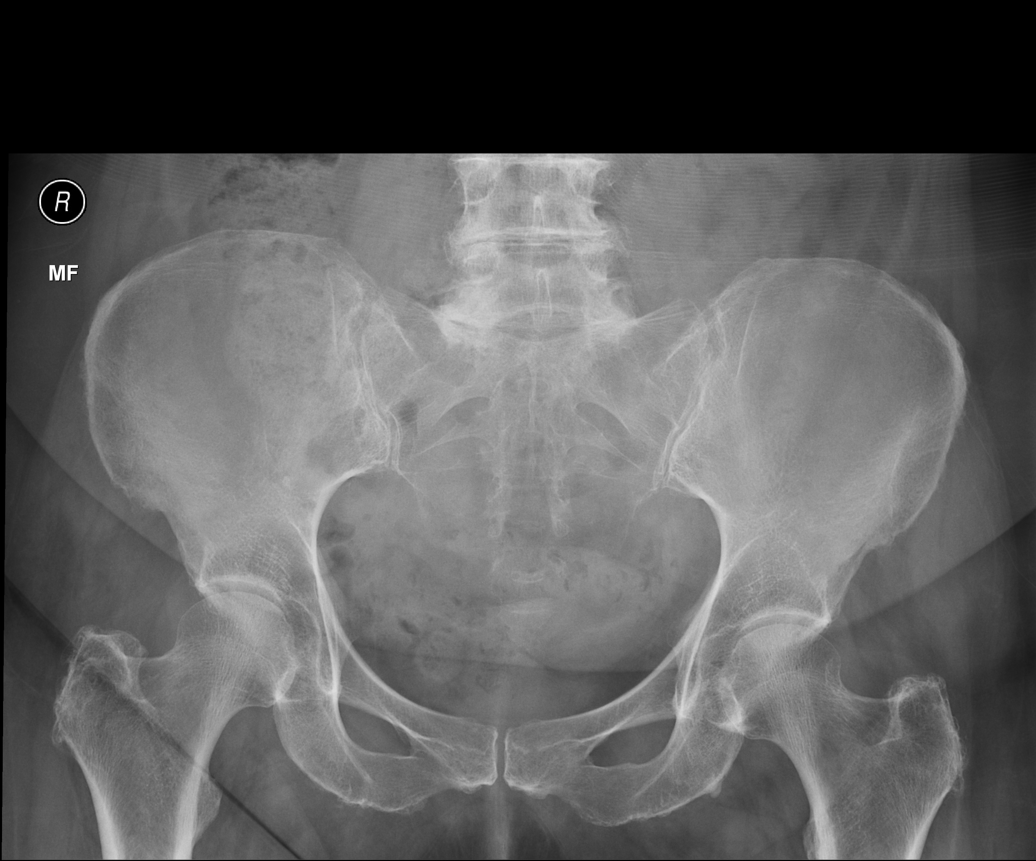

[5 of 5 positions shown; findings below may reference images not displayed]

FINDINGS: No erosions or fracture. Hip joint space heights are preserved. Mild 
degenerative change of the SI joints. Multifocal degenerative change of the 
spine. No focal soft tissue swelling.
IMPRESSION: 1.  Hip joint space heights are preserved.  
2.  Degenerative change of the SI joints/spine.

## 2023-06-23 IMAGING — MR MRI SACRUM WITHOUT CONTRAST
5 of 6 series · 32 of 48 positions shown · IV contrast (gadolinium)
Comparison: None.

________________________________________________________________________________________________ 
MRI SACRUM WITHOUT CONTRAST, 06/23/2023 [DATE]: 
CLINICAL INDICATION: Sacroiliitis, Not Elsewhere Classified
TECHNIQUE: Multiplanar, multiecho position MR images of the sacrum were 
performed without intravenous gadolinium enhancement.

[Series 301: survey · axial · 15.0mm · 1.76mm/px · z∈[-145,+42]mm · 4 of 11 slices shown]
[im 1/11]
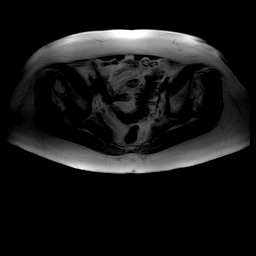
[im 4/11]
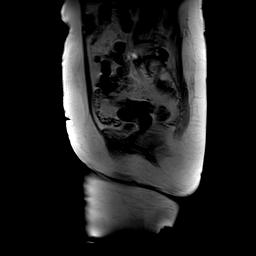
[im 7/11]
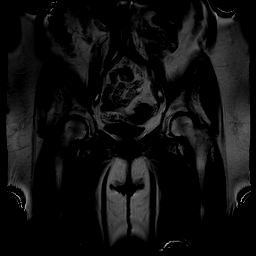
[im 11/11]
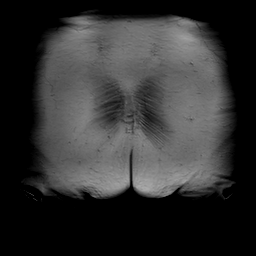

[Series 401: STIR · sagittal · 4.0mm · 0.46mm/px · 8 of 32 slices shown]
[im 1/32]
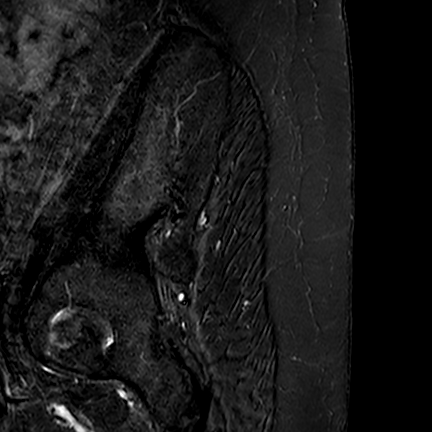
[im 4/32]
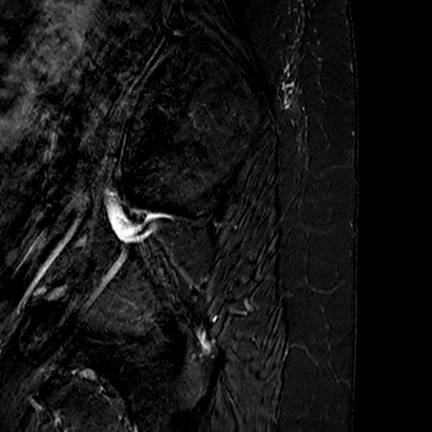
[im 11/32]
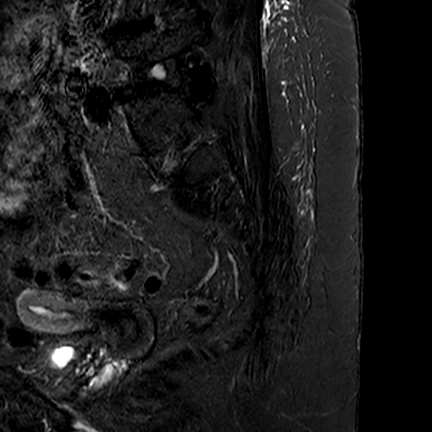
[im 14/32]
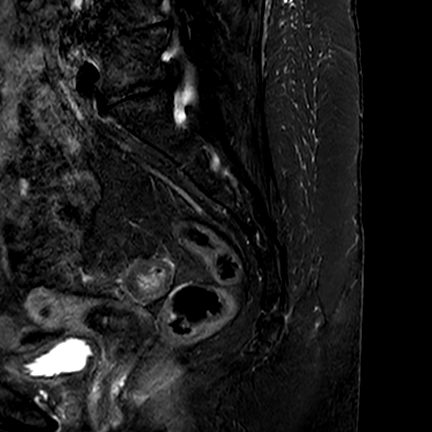
[im 18/32]
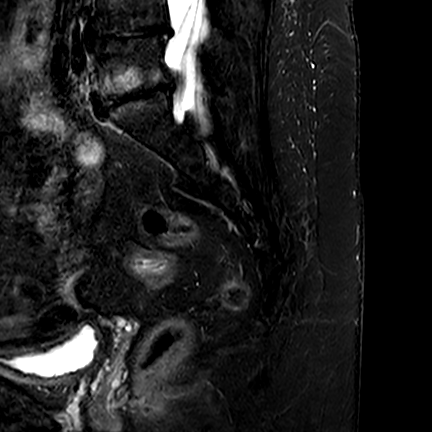
[im 21/32]
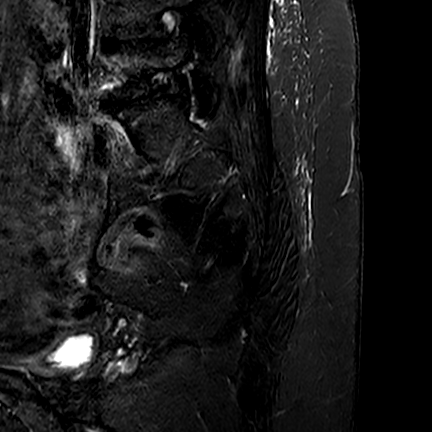
[im 28/32]
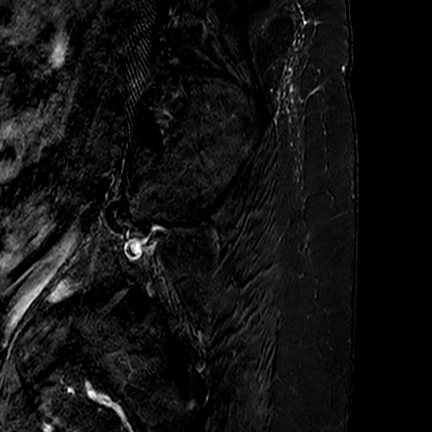
[im 32/32]
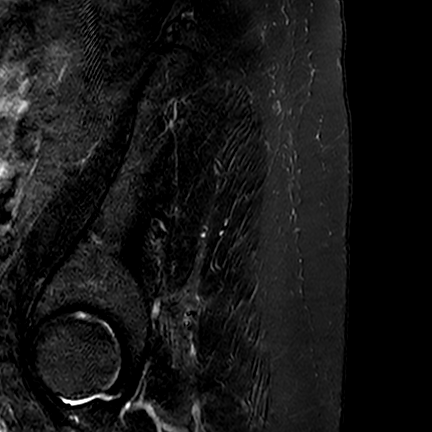

[Series 501: t2w_spair cor · coronal · 3.0mm · 0.51mm/px · 3 of 28 slices shown]
[im 1/28]
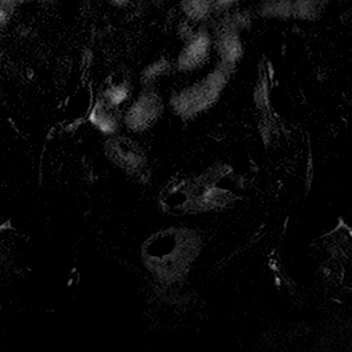
[im 4/28]
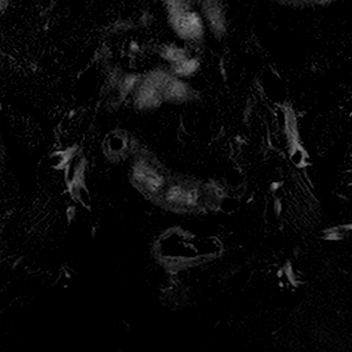
[im 8/28]
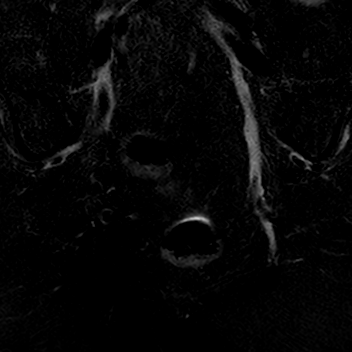

[Series 601: T1 · coronal · 3.0mm · 0.47mm/px · 8 of 28 slices shown (1 of 2)]
[im 1/28]
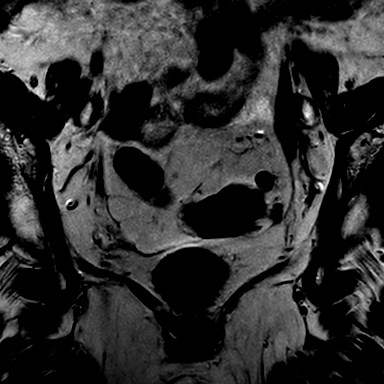
[im 4/28]
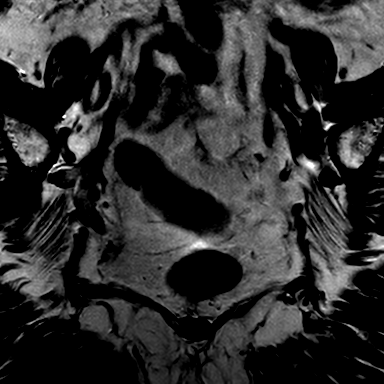
[im 8/28]
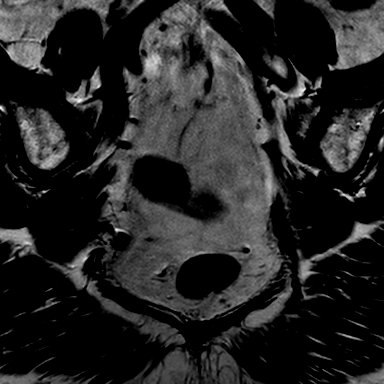
[im 12/28]
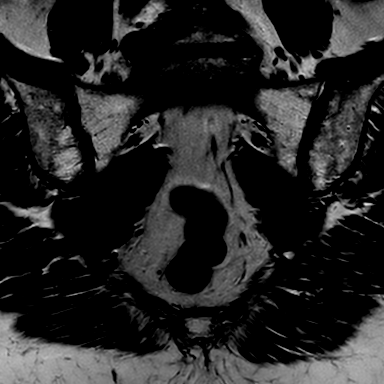
[im 16/28]
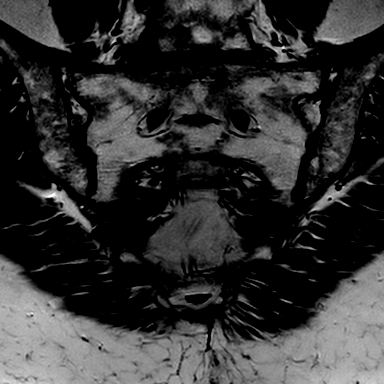
[im 20/28]
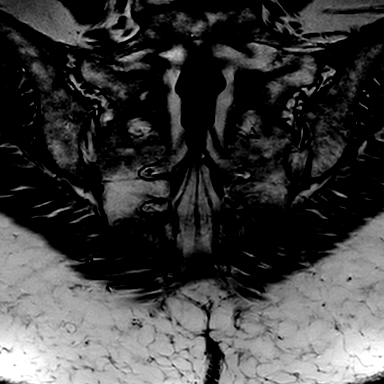
[im 24/28]
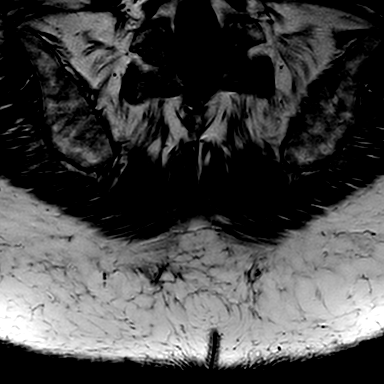
[im 28/28]
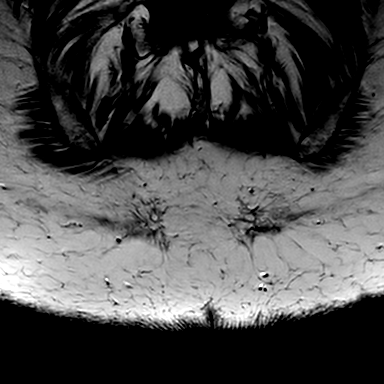

[Series 801: T1 · axial · 4.0mm · 0.46mm/px · z∈[-237,-97]mm · 9 of 32 slices shown (2 of 2)]
[im 1/32]
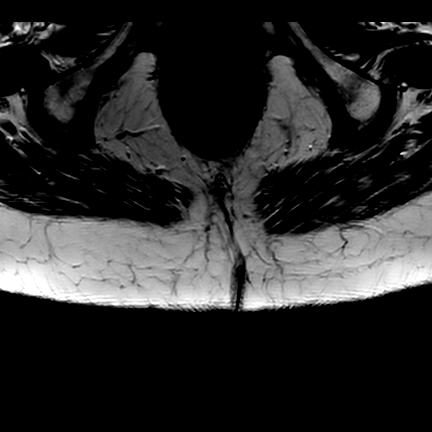
[im 4/32]
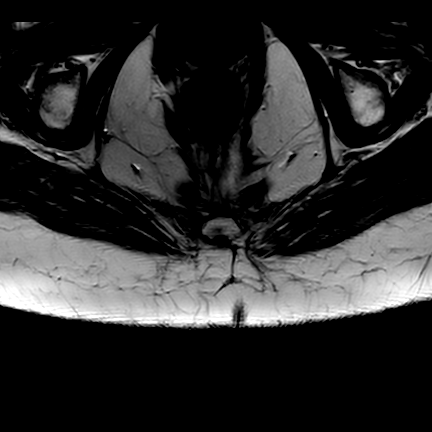
[im 8/32]
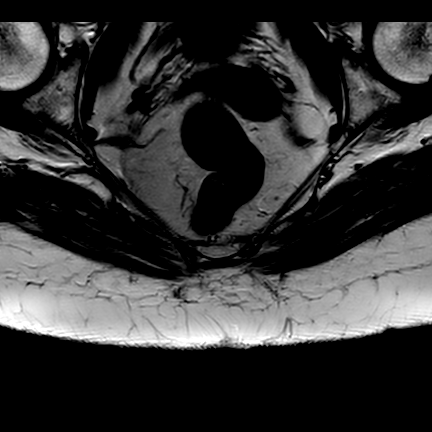
[im 12/32]
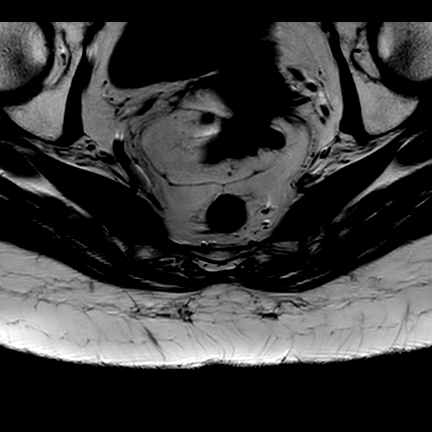
[im 16/32]
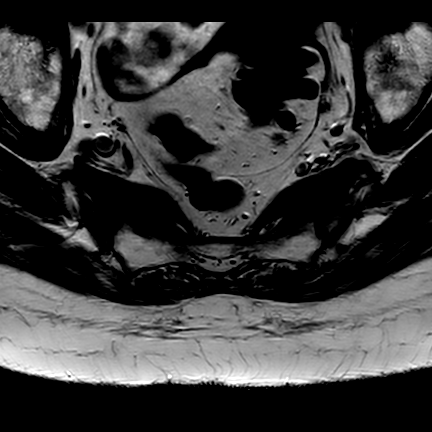
[im 20/32]
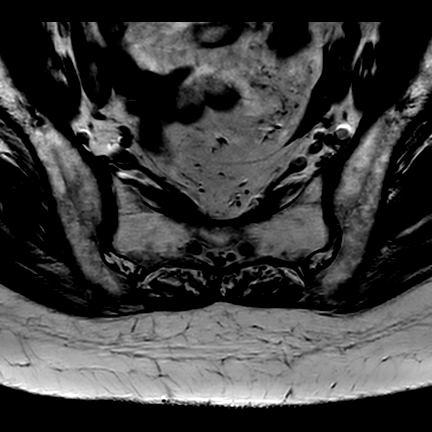
[im 24/32]
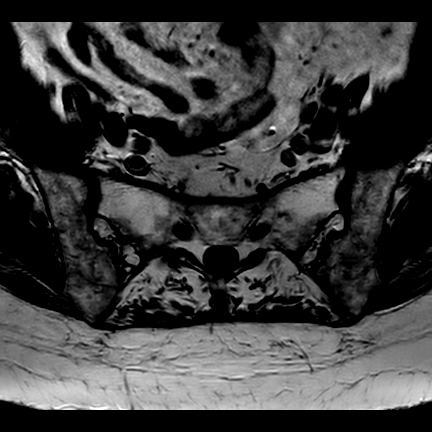
[im 28/32]
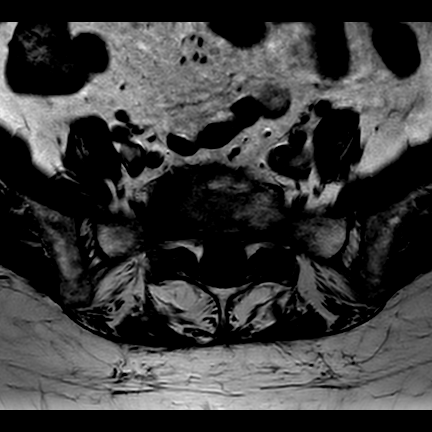
[im 32/32]
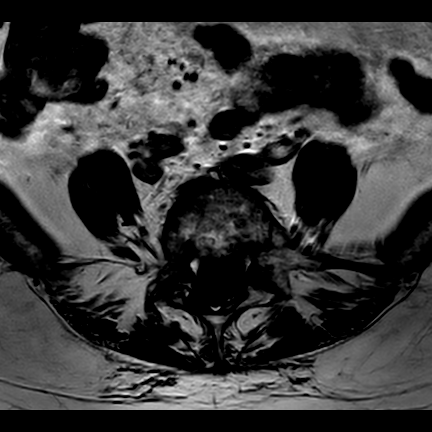

[32 of 48 positions shown; findings below may reference images not displayed]

FINDINGS: SI JOINTS: No sacroiliitis. Mild degenerative change. 
SACRUM/COCCYX: No fractures, marrow edema-like signal changes or marrow 
replacing lesions. 
ILIAC BONES: No fractures, marrow edema-like signal changes or marrow replacing 
lesions. 
LOWER LUMBAR SPINE: Multilevel degenerative change, neural spinal stenosis and 
type I Modic changes at L5-S1.  
SOFT TISSUES: Inferior paraspinal muscle atrophy. Colonic diverticulosis. No 
mass or fluid collection.
IMPRESSION: 1.  No sacroiliitis. Mild degenerative change. 
2.  Multilevel degenerative change, neural spinal stenosis and type I Modic 
changes at L5-S1.  
3.  Inferior paraspinal muscle atrophy.  
4.  Colonic diverticulosis.
